# Patient Record
Sex: Male | Born: 1985
Health system: Southern US, Community
[De-identification: ages and names within clinical notes are randomized; demographics above are authoritative.]

## PROBLEM LIST (undated history)

## (undated) DIAGNOSIS — R519 Headache, unspecified: Secondary | ICD-10-CM

## (undated) DIAGNOSIS — T4145XA Adverse effect of unspecified anesthetic, initial encounter: Secondary | ICD-10-CM

## (undated) DIAGNOSIS — M1990 Unspecified osteoarthritis, unspecified site: Secondary | ICD-10-CM

## (undated) DIAGNOSIS — G4733 Obstructive sleep apnea (adult) (pediatric): Principal | ICD-10-CM

## (undated) DIAGNOSIS — J45909 Unspecified asthma, uncomplicated: Secondary | ICD-10-CM

## (undated) DIAGNOSIS — D699 Hemorrhagic condition, unspecified: Secondary | ICD-10-CM

## (undated) DIAGNOSIS — B019 Varicella without complication: Secondary | ICD-10-CM

## (undated) DIAGNOSIS — T8859XA Other complications of anesthesia, initial encounter: Secondary | ICD-10-CM

## (undated) DIAGNOSIS — R112 Nausea with vomiting, unspecified: Secondary | ICD-10-CM

## (undated) DIAGNOSIS — Z9889 Other specified postprocedural states: Secondary | ICD-10-CM

## (undated) DIAGNOSIS — E669 Obesity, unspecified: Secondary | ICD-10-CM

## (undated) DIAGNOSIS — R51 Headache: Secondary | ICD-10-CM

## (undated) DIAGNOSIS — A630 Anogenital (venereal) warts: Secondary | ICD-10-CM

## (undated) DIAGNOSIS — I10 Essential (primary) hypertension: Secondary | ICD-10-CM

## (undated) HISTORY — DX: Headache, unspecified: R51.9

## (undated) HISTORY — PX: KNEE ARTHROSCOPY: SUR90

## (undated) HISTORY — PX: ARTHROSCOPIC REPAIR ACL: SUR80

## (undated) HISTORY — DX: Anogenital (venereal) warts: A63.0

## (undated) HISTORY — DX: Obstructive sleep apnea (adult) (pediatric): G47.33

## (undated) HISTORY — DX: Headache: R51

## (undated) HISTORY — DX: Varicella without complication: B01.9

---
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
## 2007-02-04 ENCOUNTER — Emergency Department (HOSPITAL_COMMUNITY): Admission: EM | Admit: 2007-02-04 | Discharge: 2007-02-04 | Payer: Self-pay | Admitting: Emergency Medicine

## 2007-07-19 ENCOUNTER — Emergency Department (HOSPITAL_COMMUNITY): Admission: EM | Admit: 2007-07-19 | Discharge: 2007-07-19 | Payer: Self-pay | Admitting: Emergency Medicine

## 2008-05-31 ENCOUNTER — Emergency Department (HOSPITAL_COMMUNITY): Admission: EM | Admit: 2008-05-31 | Discharge: 2008-05-31 | Payer: Self-pay | Admitting: Emergency Medicine

## 2009-11-21 ENCOUNTER — Emergency Department (HOSPITAL_COMMUNITY): Admission: EM | Admit: 2009-11-21 | Discharge: 2009-11-21 | Payer: Self-pay | Admitting: Emergency Medicine

## 2010-12-09 ENCOUNTER — Inpatient Hospital Stay (INDEPENDENT_AMBULATORY_CARE_PROVIDER_SITE_OTHER)
Admission: RE | Admit: 2010-12-09 | Discharge: 2010-12-09 | Disposition: A | Discharge status: Home / Self Care | Payer: Self-pay | Source: Ambulatory Visit | Attending: Family Medicine | Admitting: Family Medicine

## 2010-12-09 ENCOUNTER — Emergency Department (HOSPITAL_COMMUNITY)
Admission: EM | Admit: 2010-12-09 | Discharge: 2010-12-09 | Disposition: A | Discharge status: Home / Self Care | Payer: Self-pay | Attending: Emergency Medicine | Admitting: Emergency Medicine

## 2010-12-09 DIAGNOSIS — N342 Other urethritis: Secondary | ICD-10-CM

## 2010-12-09 DIAGNOSIS — J45909 Unspecified asthma, uncomplicated: Secondary | ICD-10-CM | POA: Insufficient documentation

## 2010-12-09 DIAGNOSIS — R300 Dysuria: Secondary | ICD-10-CM | POA: Insufficient documentation

## 2010-12-09 LAB — URINALYSIS, ROUTINE W REFLEX MICROSCOPIC
Bilirubin Urine: NEGATIVE
Ketones, ur: NEGATIVE mg/dL
Nitrite: NEGATIVE
Protein, ur: NEGATIVE mg/dL
Urine Glucose, Fasting: NEGATIVE mg/dL
pH: 6 (ref 5.0–8.0)

## 2010-12-09 LAB — URINE MICROSCOPIC-ADD ON

## 2010-12-09 LAB — POCT URINALYSIS DIPSTICK
Ketones, ur: NEGATIVE mg/dL
Protein, ur: NEGATIVE mg/dL
Urobilinogen, UA: 0.2 mg/dL (ref 0.0–1.0)
pH: 6 (ref 5.0–8.0)

## 2010-12-11 LAB — GC/CHLAMYDIA PROBE AMP, GENITAL
Chlamydia, DNA Probe: NEGATIVE
GC Probe Amp, Genital: NEGATIVE

## 2010-12-12 ENCOUNTER — Emergency Department (HOSPITAL_COMMUNITY)
Admission: EM | Admit: 2010-12-12 | Discharge: 2010-12-13 | Disposition: A | Discharge status: Home / Self Care | Payer: Self-pay | Attending: Emergency Medicine | Admitting: Emergency Medicine

## 2010-12-12 ENCOUNTER — Emergency Department (HOSPITAL_COMMUNITY)
Admission: EM | Admit: 2010-12-12 | Discharge: 2010-12-12 | Disposition: A | Discharge status: Home / Self Care | Payer: Self-pay | Attending: Emergency Medicine | Admitting: Emergency Medicine

## 2010-12-12 DIAGNOSIS — F17200 Nicotine dependence, unspecified, uncomplicated: Secondary | ICD-10-CM | POA: Insufficient documentation

## 2010-12-12 DIAGNOSIS — N342 Other urethritis: Secondary | ICD-10-CM | POA: Insufficient documentation

## 2010-12-12 DIAGNOSIS — N509 Disorder of male genital organs, unspecified: Secondary | ICD-10-CM | POA: Insufficient documentation

## 2010-12-12 DIAGNOSIS — R300 Dysuria: Secondary | ICD-10-CM | POA: Insufficient documentation

## 2010-12-12 DIAGNOSIS — N4889 Other specified disorders of penis: Secondary | ICD-10-CM | POA: Insufficient documentation

## 2010-12-12 DIAGNOSIS — N390 Urinary tract infection, site not specified: Secondary | ICD-10-CM | POA: Insufficient documentation

## 2010-12-12 DIAGNOSIS — J45909 Unspecified asthma, uncomplicated: Secondary | ICD-10-CM | POA: Insufficient documentation

## 2010-12-12 LAB — URINALYSIS, ROUTINE W REFLEX MICROSCOPIC
Bilirubin Urine: NEGATIVE
Ketones, ur: NEGATIVE mg/dL
Nitrite: POSITIVE — AB
Urobilinogen, UA: 0.2 mg/dL (ref 0.0–1.0)

## 2010-12-13 LAB — URINALYSIS, ROUTINE W REFLEX MICROSCOPIC
Hgb urine dipstick: NEGATIVE
Specific Gravity, Urine: 1.031 — ABNORMAL HIGH (ref 1.005–1.030)
Urobilinogen, UA: 1 mg/dL (ref 0.0–1.0)

## 2010-12-13 LAB — URINE MICROSCOPIC-ADD ON

## 2010-12-13 LAB — DIFFERENTIAL
Basophils Absolute: 0 10*3/uL (ref 0.0–0.1)
Lymphocytes Relative: 21 % (ref 12–46)
Lymphs Abs: 1.9 10*3/uL (ref 0.7–4.0)
Monocytes Absolute: 0.8 10*3/uL (ref 0.1–1.0)
Monocytes Relative: 8 % (ref 3–12)
Neutro Abs: 6.4 10*3/uL (ref 1.7–7.7)

## 2010-12-13 LAB — CBC
HCT: 45.1 % (ref 39.0–52.0)
Hemoglobin: 16 g/dL (ref 13.0–17.0)
MCH: 29.6 pg (ref 26.0–34.0)
MCHC: 35.5 g/dL (ref 30.0–36.0)

## 2010-12-13 LAB — URINE CULTURE

## 2010-12-13 LAB — BASIC METABOLIC PANEL
CO2: 29 mEq/L (ref 19–32)
Calcium: 9.6 mg/dL (ref 8.4–10.5)
Glucose, Bld: 82 mg/dL (ref 70–99)
Sodium: 142 mEq/L (ref 135–145)

## 2010-12-14 LAB — URINE CULTURE
Colony Count: NO GROWTH
Culture  Setup Time: 201202291044

## 2010-12-14 LAB — HERPES SIMPLEX VIRUS CULTURE: Culture: DETECTED

## 2011-02-14 ENCOUNTER — Emergency Department (HOSPITAL_COMMUNITY)
Admission: EM | Admit: 2011-02-14 | Discharge: 2011-02-14 | Disposition: A | Discharge status: Home / Self Care | Payer: Self-pay | Attending: Emergency Medicine | Admitting: Emergency Medicine

## 2011-02-14 DIAGNOSIS — F17200 Nicotine dependence, unspecified, uncomplicated: Secondary | ICD-10-CM | POA: Insufficient documentation

## 2011-02-14 DIAGNOSIS — R300 Dysuria: Secondary | ICD-10-CM | POA: Insufficient documentation

## 2011-02-14 DIAGNOSIS — J45909 Unspecified asthma, uncomplicated: Secondary | ICD-10-CM | POA: Insufficient documentation

## 2011-02-14 LAB — URINALYSIS, ROUTINE W REFLEX MICROSCOPIC
Bilirubin Urine: NEGATIVE
Glucose, UA: NEGATIVE mg/dL
Hgb urine dipstick: NEGATIVE
Ketones, ur: NEGATIVE mg/dL
Protein, ur: NEGATIVE mg/dL

## 2011-03-02 NOTE — Op Note (Signed)
[place]. [facility]  Patient:    [name] [name], [name] [name] Visit Number: [id number] MRN: [id number]          Service Type: Attending:  Loreta Ave, M.D. Dictated by:   Loreta Ave, M.D. Proc. Date: 07/10/01                             Operative Report  PREOPERATIVE DIAGNOSIS:  Torn lateral meniscus, right knee.  POSTOPERATIVE DIAGNOSES:  Torn lateral meniscus, right knee, with chondral abrasive changes, lateral femoral condyle.  PROCEDURE:  Right knee exam under anesthesia, arthroscopy, partial lateral meniscectomy and chondroplasty of lateral femoral condyle.  SURGEON:  Loreta Ave, M.D.  ASSISTANT:  Arlys John D. Petrarca, P.A.-C.  ANESTHESIA:  General.  ESTIMATED BLOOD LOSS:  Minimal.  TOURNIQUET TIME:  30 minutes.  SPECIMENS:  None.  CULTURES:  None.  COMPLICATIONS:  None.  DRESSINGS:  Self-compressive.  DESCRIPTION OF PROCEDURE:  The patient was brought to the operating room and placed on the operating table in a supine position.  After adequate anesthesia had been obtained, the right knee was examined.  Full motion, good stability, positive lateral McMurrays.  Tourniquet and leg holder were applied.  Leg prepped and draped in the usual sterile fashion.  Three portals created:  one superolateral, one each medial and lateral parapatellar.  Inflow catheter introduced.  Knee extended.  Arthroscope introduced.  Knee inspected. Patellofemoral joint had good tracking and cartilage.  Cruciate ligaments attached.  Medial meniscus, medial compartment normal.  Lateral meniscus, radial tear.  Junction, middle and posterior third, with fragmentation and intrameniscal tearing.  ______ out and tapered in smoothly on either side salvaging a rim in the mid portion of the tear and a fair amount of the posterior and anterior aspects of the meniscus at completion.  Care taken to remove all fragments and all recesses examined to be sure all  fragments removed.  Superficial grade 2 chondromalacia on the condyle above this, and this was treated with chondroplasty to a stable surface.  Entire knee examined.  Hypertrophic synovitis removed.  No other significant findings appreciated.  ______ fluid removed.  During the procedure, the tourniquet was inflated for 30 minutes at 350 mmHg to allow visualization.  This was deflated at completion.  Portals were then injected with Marcaine.  The portals were closed with 4-0 nylon.  Sterile compressive dressing applied.  Anesthesia reversed, brought to recovery room.  Tolerated surgery well.  No complications. Dictated by:   Loreta Ave, M.D. Attending:  Loreta Ave, M.D. DD:  07/10/01 TD:  07/10/01 Job: [id number]

## 2011-03-02 NOTE — Op Note (Signed)
NAME:  [name] [name], [name] [name]                       ACCOUNT NO.:  [id number]   MEDICAL RECORD NO.:  [id number]                   PATIENT TYPE:  AMB   LOCATION:  DSC                                  FACILITY:  [facility]   PHYSICIAN:  Robert A. Thurston Hole, M.D.              DATE OF BIRTH:  05/27/1986   DATE OF PROCEDURE:  07/08/2002  DATE OF DISCHARGE:  07/08/2002                                 OPERATIVE REPORT   PREOPERATIVE DIAGNOSES:  1. Left knee anterior cruciate ligament tear.  2. Left knee medial collateral tear.  3. Left knee partial posterior cruciate ligament tear.  4. Left knee lateral meniscus tear.   POSTOPERATIVE DIAGNOSES:  1. Left knee anterior cruciate ligament tear.  2. Left knee medial collateral tear.  3. Left knee partial posterior cruciate ligament tear.  4. Left knee lateral meniscus tear.   PROCEDURES:  1. Left knee examination under anesthesia followed by arthroscopically     assisted endoscopic bone/patellar tendon/bone autograft anterior cruciate     ligament reconstruction using 9 x 25 mm femoral Interference Bioscrew and     9 x 25 mm tibial Interference Bioscrew.  2. Left knee medial collateral ligament repair.  3. Left knee partial posterior cruciate ligament tear debridement.  4. Left knee partial lateral meniscectomy.   SURGEON:  Elana Alm. Thurston Hole, M.D.   ASSISTANTS:  Loreta Ave, M.D., and Julien Girt, P.A.   ANESTHESIA:  General.   OPERATIVE TIME:  Three hours.   TOURNIQUET TIME:  One hour and 40 minutes.   COMPLICATIONS:  None.   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  He is now to undergo arthroscopy and repair.   DESCRIPTION OF PROCEDURE:  The patient was brought to the operating room on  July 08, 2002, and placed on the operating  table in the supine  position.  After an adequate level of general anesthesia was obtained, his  left knee was examined under anesthesia.  He had range of motion from 0-125  degrees.  He had a 2+ Lachman and positive pivot shift.  He had 2+ valgus  instability in full extension and in 30 degrees of flexion.  He had 1+  posterior drawer.  He had no varus instability.  He had normal patellar  tracking.  The left leg was prepped using sterile DuraPrep and draped using  sterile technique.  He received Ancef 1 g IV preoperatively for prophylaxis.  The leg was exsanguinated and a thigh tourniquet elevated to 350 mm.  Initially the medial collateral ligament was exposed through a 10 cm medial  incision.  The underlying subcutaneous tissues were incised in line with the  skin incision.  The fascia over the MCL region was  incised longitudinally,  revealing a completely torn medial collateral ligament with the posterior  bundle of the MCL being torn off of the femur and the anterior portion being  torn off of the tibia.  The torn portions of the ligament were carefully  dissected and teased and freed up from surrounding granulation tissue.  The  underlying medial meniscus and medial compartment in the knee could be  visualized through this opening in the medial capsule where the MCL tear  was.  At this point, two separate Arthrex 5.0 suture anchors were placed in  the medial femoral condyle for repair.  One of these with a #2 fiber wire  was used in a weaving locking stitch in the portion of the MCL that was torn  off of the tibia.  This weaving stitch was then brought down to the end of  the ligament and then sutured through the periosteum on the proximal tibia.  Prior to tying this down completely, two other #2 Ethibond sutures were  placed through this portion of the MCL and through the coronary ligaments of  the medial meniscus and tied down.  The posterior aspect of the MCL tear was  then also  repaired back to its femoral attachment using the same suture  anchors.  After this repair was carried out, the knee was tested for medial  stability and the valgus instability was found to be corrected.  At this  point then, attention was turned to inspecting the inside of the knee with  the arthroscope.  Anterolateral and anteromedial portals were made.  The  arthroscope with a pump was attached and placed into the anterolateral  portal and an arthroscopic debrider placed in the anteromedial portal.  The  medial compartment was inspected.  The medial femoral condyle and medial  tibial plateau articular cartilage was intact and the medial meniscus was  intact.  The intercondylar notch was inspected.  The anterior cruciate  ligament was completely torn with significant anterior laxity.  This was  thoroughly debrided and a small notchplasty was performed.  The posterior  cruciate had at least 60-70% still intact, but the anterior bundle was  completely torn and somewhat shredded.  This could not be repaired and this  was debrided, but there was only 1-2 mm of posterior laxity.  There was 5 mm  of anterior laxity and we did not feel that a PCL reconstruction was  indicated because there was still good viability of his posterior cruciate.  The lateral compartment was inspected.  Articular cartilage, lateral  femoral, and lateral plateau were normal.  The lateral meniscus was probed  and he had a radial tear of the posterolateral corner, which was resected.  Thirty to forty percent of the posterolateral corner was resected back to a  stable rim.  The popliteus tendon was intact.  The patellofemoral joint was  inspected.  The articular cartilage in this joint was normal and the patella  tracked normally.  At this point then, the ACL autograft was harvested  through a 5-7 cm anterior incision based over the patellar tendon.  The underlying subcutaneous tissues were incised in line with the skin  incision.  The patellar tendon was measured and found to be 35 mm in width.  A central  10 mm were harvested with 10 x 25 mm of patellar bone and tibial tubercle.  After this was done, then using the tibial drill guide, a Steinmann pin was  drilled up in the ACL insertion  point on the tibial plateau and through a  1.5 cm anteromedial incision a 10 mm drill hole was made in the proximal  tibia over this Steinmann pin.  Through this hole, the posterior femoral  guide was placed in the posterior femoral notch and the Steinmann pin  drilled up into the ACL origin point on the posterior femoral notch and then  overdrilled with a 10 mm drill to a depth of 30 mm, leaving a posterior 2 mm  bone bridge.  A double pin passer was brought up to the tibial tunnel  through the joint and to the femoral tunnel through the femoral cortex and  thigh through a stab wound.  This was used to pass the ACL graft up through  the tibial tunnel and joint into the femoral tunnel.  It was locked into  position there with a 9 x 25 mm Interference Bioscrew.  The knee was then  taken through a full range of motion.  There was found to be in impingement  of the graft.  The tibial bone plug was then locked into its tunnel with a 9  x 25 mm Interference Bioscrew as well with the knee in 30 degrees of flexion  and the tibia held reduced on the femur.  After this was done, he was tested  for stability.  The Lachman and pivot shift had been eliminated and he could  be brought through a full range of motion with no impingement of the graft.  At this point, the VMO fascia over the [facility] repair was closed with 0 Vicryl  suture.  The patellar tendon harvest region was closed loosely with 0  Vicryl.  The subcutaneous tissues over the incisions were closed with 2-0  Vicryl.  The skin was closed with skin staples.  Sterile dressings were  applied and a long-leg splint applied.  The tourniquet had been previously  released.  The patient  was then awaken and taken to the recovery room in  stable condition.  The needle and sponge counts were correct x 2 at the end  of the case.   FOLLOW UP CARE:  He will be [facility]  for IV pain control and neurovascular monitoring.  He will be discharged  tomorrow on Percocet and Naprosyn with touch down weightbearing.  We will  see him back in the office in a week for wound check and follow-up.                                               Robert A. Thurston Hole, M.D.    RAW/MEDQ  D:  07/08/2002  T:  07/10/2002  Job:  [id number]

## 2011-09-28 ENCOUNTER — Emergency Department (HOSPITAL_COMMUNITY)
Admission: EM | Admit: 2011-09-28 | Discharge: 2011-09-28 | Disposition: A | Discharge status: Home / Self Care | Payer: Self-pay | Attending: Emergency Medicine | Admitting: Emergency Medicine

## 2011-09-28 ENCOUNTER — Encounter: Payer: Self-pay | Admitting: Emergency Medicine

## 2011-09-28 DIAGNOSIS — F17200 Nicotine dependence, unspecified, uncomplicated: Secondary | ICD-10-CM | POA: Insufficient documentation

## 2011-09-28 DIAGNOSIS — L299 Pruritus, unspecified: Secondary | ICD-10-CM | POA: Insufficient documentation

## 2011-09-28 DIAGNOSIS — B86 Scabies: Secondary | ICD-10-CM | POA: Insufficient documentation

## 2011-09-28 HISTORY — DX: Obesity, unspecified: E66.9

## 2011-09-28 MED ORDER — PERMETHRIN 5 % EX CREA: TOPICAL_CREAM | CUTANEOUS | Status: AC

## 2011-09-28 NOTE — ED Provider Notes (Signed)
Evaluation and management procedures were performed by the mid-level provider (PA/NP/CNM) under my supervision/collaboration. I was present and available during the ED course. Jaiveer Panas Y.   Amante Fomby Y. Dreon Pineda, MD 09/28/11 0652 

## 2011-09-28 NOTE — ED Notes (Signed)
PT. REPORTS GENERALIZED ITCHY RASHES FOR 3 WEEKS - WORSE PAST FEW DAYS .

## 2011-09-28 NOTE — ED Provider Notes (Signed)
History     CSN: [id number] Arrival date & time: 09/28/2011  1:12 AM   First MD Initiated Contact with Patient 09/28/11 0148      Chief Complaint  Patient presents with  . Rash    (Consider location/radiation/quality/duration/timing/severity/associated sxs/prior treatment) HPI Comments: Patient here with itchy rash which started 3 weeks ago to his groin area, it has now spread to waistlilne, back and in between his finger - reports papular and extremely itchy  Patient is a 25 y.o. male presenting with rash. The history is provided by the patient. No language interpreter was used.  Rash  This is a new problem. The current episode started more than 1 week ago. The problem has not changed since onset.The problem is associated with nothing. There has been no fever. The rash is present on the groin, genitalia and back. The pain is at a severity of 0/10. The patient is experiencing no pain. The pain has been constant since onset. Associated symptoms include itching. Pertinent negatives include no blisters, no pain and no weeping. He has tried nothing for the symptoms. The treatment provided no relief.    Past Medical History  Diagnosis Date  . Obesity     Past Surgical History  Procedure Date  . Knee surgery     No family history on file.  History  Substance Use Topics  . Smoking status: Current Everyday Smoker  . Smokeless tobacco: Not on file  . Alcohol Use: Yes     OCCASIONAL       Review of Systems  Skin: Positive for itching and rash.  All other systems reviewed and are negative.    Allergies  Review of patient's allergies indicates no known allergies.  Home Medications  No current outpatient prescriptions on file.  BP 148/90  Pulse 78  Temp(Src) 97.4 F (36.3 C) (Oral)  Resp 16  SpO2 98%  Physical Exam  Nursing note and vitals reviewed. Constitutional: He is oriented to person, place, and time. He appears well-developed and well-nourished. No distress.   HENT:  Head: Normocephalic and atraumatic.  Right Ear: External ear normal.  Left Ear: External ear normal.  Eyes: Conjunctivae are normal. Pupils are equal, round, and reactive to light.  Neck: Normal range of motion. Neck supple.  Cardiovascular: Normal rate, regular rhythm and normal heart sounds.   Pulmonary/Chest: Effort normal and breath sounds normal. No respiratory distress. He has no wheezes.  Abdominal: Soft. Bowel sounds are normal. He exhibits no distension. There is no tenderness.  Musculoskeletal: Normal range of motion.  Neurological: He is alert and oriented to person, place, and time.  Skin: Rash noted.       Papular rash noted to groin, between fingers and along waist line  Psychiatric: He has a normal mood and affect. His behavior is normal. Judgment and thought content normal.    ED Course  Procedures (including critical care time)  Labs Reviewed - No data to display No results found.   Scabies    MDM  Patient with scabies rash - will treat - instructed in treatment protocol and environmental treatment as well.       Izola Price [place], [place] 09/28/11 [phone]

## 2011-09-28 NOTE — ED Notes (Signed)
Terminal clean upon discharge - scabies diagnosis

## 2012-01-12 ENCOUNTER — Other Ambulatory Visit (HOSPITAL_COMMUNITY): Payer: Self-pay | Admitting: Hematology

## 2013-01-08 ENCOUNTER — Encounter (HOSPITAL_COMMUNITY): Payer: Self-pay | Admitting: Family Medicine

## 2013-01-08 ENCOUNTER — Emergency Department (HOSPITAL_COMMUNITY)
Admission: EM | Admit: 2013-01-08 | Discharge: 2013-01-08 | Disposition: A | Discharge status: Home / Self Care | Payer: Self-pay | Attending: Emergency Medicine | Admitting: Emergency Medicine

## 2013-01-08 DIAGNOSIS — K089 Disorder of teeth and supporting structures, unspecified: Secondary | ICD-10-CM | POA: Insufficient documentation

## 2013-01-08 DIAGNOSIS — J45909 Unspecified asthma, uncomplicated: Secondary | ICD-10-CM | POA: Insufficient documentation

## 2013-01-08 DIAGNOSIS — K0889 Other specified disorders of teeth and supporting structures: Secondary | ICD-10-CM

## 2013-01-08 DIAGNOSIS — F17200 Nicotine dependence, unspecified, uncomplicated: Secondary | ICD-10-CM | POA: Insufficient documentation

## 2013-01-08 DIAGNOSIS — E669 Obesity, unspecified: Secondary | ICD-10-CM | POA: Insufficient documentation

## 2013-01-08 HISTORY — DX: Unspecified asthma, uncomplicated: J45.909

## 2013-01-08 MED ORDER — OXYCODONE-ACETAMINOPHEN 5-325 MG PO TABS: 2.0000 | ORAL_TABLET | ORAL | Status: DC | PRN

## 2013-01-08 MED ORDER — PENICILLIN V POTASSIUM 500 MG PO TABS: 500.0000 mg | ORAL_TABLET | Freq: Four times a day (QID) | ORAL | Status: AC

## 2013-01-08 NOTE — ED Notes (Signed)
Patient states he has had a toothache for the past 2 weeks. Has taken Excedrin and BC without relief of pain.

## 2013-01-08 NOTE — ED Provider Notes (Signed)
History     CSN: [id number]  Arrival date & time 01/08/13  0254   First MD Initiated Contact with Patient 01/08/13 [phone]      Chief Complaint  Patient presents with  . Dental Pain    (Consider location/radiation/quality/duration/timing/severity/associated sxs/prior treatment) HPI Comments: Patient complains of left lower molar pain for the past 2 weeks it is worse over the past 3 days. Denies any trauma. The pain is radiating to his upper jaw as well. No difficulty breathing or swallowing. Taking Excedrin and BC powders without relief. No chest pain, shortness of breath, cough, fever vomiting. Does not have a dentist.  The history is provided by the patient.    Past Medical History  Diagnosis Date  . Obesity   . Asthma     Past Surgical History  Procedure Laterality Date  . Knee surgery    . Arthroscopic repair acl      No family history on file.  History  Substance Use Topics  . Smoking status: Current Every Day Smoker -- 0.50 packs/day    Types: Cigarettes  . Smokeless tobacco: Not on file  . Alcohol Use: Yes     Comment: OCCASIONAL       Review of Systems  Constitutional: Negative for activity change and appetite change.  HENT: Positive for dental problem. Negative for congestion and rhinorrhea.   Respiratory: Negative for cough and shortness of breath.   Cardiovascular: Negative for chest pain.  Gastrointestinal: Negative for nausea, vomiting and abdominal pain.  Genitourinary: Negative for dysuria and hematuria.  Musculoskeletal: Negative for back pain.  Skin: Negative for wound.  Neurological: Negative for dizziness, weakness and headaches.  A complete 10 system review of systems was obtained and all systems are negative except as noted in the HPI and PMH.    Allergies  Review of patient's allergies indicates no known allergies.  Home Medications  No current outpatient prescriptions on file.  BP 145/73  Pulse 73  Temp(Src) 97.7 F (36.5 C)  (Oral)  Resp 18  Ht 5\' 11"  (1.803 m)  Wt 365 lb (165.563 kg)  BMI 50.93 kg/m2  SpO2 97%  Physical Exam  Constitutional: He is oriented to person, place, and time. He appears well-developed and well-nourished. No distress.  HENT:  Head: Normocephalic and atraumatic.  Mouth/Throat: Oropharynx is clear and moist. No oropharyngeal exudate.    TTP to palpation. No abscess.  Floor of mouth soft. No trismus  Eyes: Conjunctivae and EOM are normal. Pupils are equal, round, and reactive to light.  Neck: Normal range of motion. Neck supple.  Cardiovascular: Normal rate, regular rhythm and normal heart sounds.   No murmur heard. Pulmonary/Chest: Effort normal and breath sounds normal. No respiratory distress.  Abdominal: Soft. There is no tenderness. There is no rebound and no guarding.  Musculoskeletal: Normal range of motion. He exhibits no edema and no tenderness.  Neurological: He is alert and oriented to person, place, and time. No cranial nerve deficit. He exhibits normal muscle tone. Coordination normal.  Skin: Skin is warm.    ED Course  Procedures (including critical care time)  Labs Reviewed - No data to display No results found.   No diagnosis found.    MDM  Dental pain without evidence of abscess. Floor of mouth soft, no Ludwig angina.  No difficulty breathing or swallowing. Pain medication, antibiotics, referral to dentistry.       Glynn Octave, MD 01/08/13 [phone]

## 2013-02-20 ENCOUNTER — Encounter (HOSPITAL_COMMUNITY): Payer: Self-pay | Admitting: Emergency Medicine

## 2013-02-20 ENCOUNTER — Emergency Department (HOSPITAL_COMMUNITY)
Admission: EM | Admit: 2013-02-20 | Discharge: 2013-02-20 | Disposition: A | Discharge status: Home / Self Care | Payer: Self-pay | Attending: Emergency Medicine | Admitting: Emergency Medicine

## 2013-02-20 DIAGNOSIS — E669 Obesity, unspecified: Secondary | ICD-10-CM | POA: Insufficient documentation

## 2013-02-20 DIAGNOSIS — R110 Nausea: Secondary | ICD-10-CM | POA: Insufficient documentation

## 2013-02-20 DIAGNOSIS — J45909 Unspecified asthma, uncomplicated: Secondary | ICD-10-CM | POA: Insufficient documentation

## 2013-02-20 DIAGNOSIS — F17200 Nicotine dependence, unspecified, uncomplicated: Secondary | ICD-10-CM | POA: Insufficient documentation

## 2013-02-20 DIAGNOSIS — H53149 Visual discomfort, unspecified: Secondary | ICD-10-CM | POA: Insufficient documentation

## 2013-02-20 DIAGNOSIS — Z79899 Other long term (current) drug therapy: Secondary | ICD-10-CM | POA: Insufficient documentation

## 2013-02-20 DIAGNOSIS — Z8669 Personal history of other diseases of the nervous system and sense organs: Secondary | ICD-10-CM

## 2013-02-20 DIAGNOSIS — Z7982 Long term (current) use of aspirin: Secondary | ICD-10-CM | POA: Insufficient documentation

## 2013-02-20 DIAGNOSIS — K089 Disorder of teeth and supporting structures, unspecified: Secondary | ICD-10-CM | POA: Insufficient documentation

## 2013-02-20 DIAGNOSIS — K0889 Other specified disorders of teeth and supporting structures: Secondary | ICD-10-CM

## 2013-02-20 DIAGNOSIS — G43909 Migraine, unspecified, not intractable, without status migrainosus: Secondary | ICD-10-CM | POA: Insufficient documentation

## 2013-02-20 HISTORY — DX: Headache: R51

## 2013-02-20 MED ORDER — DIPHENHYDRAMINE HCL 50 MG/ML IJ SOLN
50.0000 mg | Freq: Once | INTRAMUSCULAR | Status: AC
  Administered 2013-02-20: 50 mg via INTRAMUSCULAR
  Filled 2013-02-20: qty 1

## 2013-02-20 MED ORDER — METOCLOPRAMIDE HCL 5 MG/ML IJ SOLN
10.0000 mg | Freq: Once | INTRAMUSCULAR | Status: AC
  Administered 2013-02-20: 10 mg via INTRAMUSCULAR
  Filled 2013-02-20: qty 2

## 2013-02-20 NOTE — ED Notes (Signed)
Pt sts that he has had a HA for the past 5 days. Pt has a hx of this and takes medication at home. Pt home meds are not working.

## 2013-02-20 NOTE — Progress Notes (Signed)
[facility] CL gave patient an OC application.

## 2013-02-20 NOTE — ED Provider Notes (Signed)
History     CSN: [id number]  Arrival date & time 02/20/13  7829   First MD Initiated Contact with Patient 02/20/13 1143      No chief complaint on file.   (Consider location/radiation/quality/duration/timing/severity/associated sxs/prior treatment) HPI Comments: [name] [name] is a 27 y/o with PMHx of migraines and asthma, presenting to the ED with a migraine episode. Patient reported that he started with a headache on Monday that has gotten progressively worse over the past couple of days. Described headache as a constat throbbing sensation without radiation. Patient reported that he has been experiencing dental pain that has been ongoing, but was seen by dentist yesterday, Dr. Paralee Cancel - was prescribed pain medications, antibiotic therapy, and oral surgery referral. Patient reported that he has had a history of migraines since he was much younger. Associated symptoms as nausea and photophobia. Denied blurred vision, chest pain, shortness of breathe, difficulty breathing, abdominal pain, diarrhea, vomiting, numbness and tingling to extremities, head injury, trauma, amnesia.    The history is provided by the patient. No language interpreter was used.    Past Medical History  Diagnosis Date  . Obesity   . Asthma   . Headache     Past Surgical History  Procedure Laterality Date  . Knee surgery    . Arthroscopic repair acl      No family history on file.  History  Substance Use Topics  . Smoking status: Current Every Day Smoker -- 0.50 packs/day    Types: Cigarettes  . Smokeless tobacco: Not on file  . Alcohol Use: Yes     Comment: OCCASIONAL       Review of Systems  Constitutional: Negative for fever, chills and fatigue.  HENT: Positive for dental problem. Negative for sore throat, mouth sores, neck pain and neck stiffness.   Eyes: Negative for pain and visual disturbance.  Respiratory: Negative for cough, chest tightness and shortness of breath.   Cardiovascular:  Negative for chest pain.  Gastrointestinal: Positive for nausea. Negative for vomiting, abdominal pain, diarrhea and constipation.  Genitourinary: Negative for decreased urine volume and difficulty urinating.  Neurological: Positive for headaches. Negative for dizziness, weakness, light-headedness and numbness.  All other systems reviewed and are negative.    Allergies  Review of patient's allergies indicates no known allergies.  Home Medications   Current Outpatient Rx  Name  Route  Sig  Dispense  Refill  . amoxicillin (AMOXIL) 500 MG capsule   Oral   Take 500 mg by mouth 4 (four) times daily.         Marland Kitchen aspirin 325 MG tablet   Oral   Take 325 mg by mouth daily.         Marland Kitchen aspirin-acetaminophen-caffeine (EXCEDRIN MIGRAINE) 250-250-65 MG per tablet   Oral   Take 1 tablet by mouth every 6 (six) hours as needed for pain.         . Aspirin-Salicylamide-Caffeine (BC FAST PAIN RELIEF) 650-195-33.3 MG PACK   Oral   Take 1 packet by mouth every 6 (six) hours as needed (pain).         Marland Kitchen diphenhydramine-acetaminophen (TYLENOL PM) 25-500 MG TABS   Oral   Take 1 tablet by mouth at bedtime as needed (pain/sleep).         Marland Kitchen HYDROcodone-acetaminophen (NORCO) 7.5-325 MG per tablet   Oral   Take 1 tablet by mouth every 6 (six) hours as needed for pain.           BP  163/103  Pulse 83  Temp(Src) 98.2 F (36.8 C) (Oral)  Resp 18  SpO2 100%  Physical Exam  Nursing note and vitals reviewed. Constitutional: He is oriented to person, place, and time. He appears well-developed and well-nourished. No distress.  HENT:  Head: Normocephalic and atraumatic.  Mouth/Throat: Oropharynx is clear and moist. No oropharyngeal exudate.    Uvula midline, symmetrical elevation  Mouth: Negative facial swelling. Negative erythema, swelling, lesions, sores, inflammation noted to buccal mucosa. Negative trismus, negative subglingual lesion - r/o Ludwig's angina. Negative abscess and cyst  formation. Negative bleeding or drainage from gums.   Eyes: Conjunctivae and EOM are normal. Pupils are equal, round, and reactive to light. Right eye exhibits no discharge. Left eye exhibits no discharge.  Neck: Normal range of motion. No tracheal deviation present.  Negative nuchal rigidity Negative neck stiffness  Cardiovascular: Normal rate, regular rhythm and normal heart sounds.  Exam reveals no friction rub.   No murmur heard. Pulses:      Radial pulses are 2+ on the right side, and 2+ on the left side.       Dorsalis pedis pulses are 2+ on the right side, and 2+ on the left side.  Pulmonary/Chest: Effort normal and breath sounds normal. No respiratory distress. He has no wheezes. He has no rales.  Musculoskeletal: Normal range of motion. He exhibits no edema and no tenderness.  Full ROM to upper and lower extremities bilaterally Strength 5+/5+ to upper and lower extremities bilaterally  Lymphadenopathy:    He has no cervical adenopathy.  Neurological: He is oriented to person, place, and time. No cranial nerve deficit. He exhibits normal muscle tone. Coordination normal.  Sensation intact to upper and lower extremities bilaterally with differentiation to sharp and dull sensation.  Skin: Skin is warm and dry. No rash noted. He is not diaphoretic. No erythema.  Psychiatric: He has a normal mood and affect. His behavior is normal. Thought content normal.    ED Course  Procedures (including critical care time)  Labs Reviewed - No data to display No results found.   1. Migraine   2. History of migraine   3. Pain, dental       MDM  Patient has long history of migraines. Pain controlled in ED setting. No neurovascular, motor or sensory damage noted. Patient discharged. Referred to neurology for follow-up. Discussed with patient to continue medications for dental pain and follow-up with oral surgeon that dentist referred patient to. Discussed with patient to monitor symptoms and  if symptoms are to worsen or change to report back to the ED. Patient agreed to plan of care, understood, all questions answered.   Raymon Mutton, PA-C 02/20/13 1712

## 2013-02-23 NOTE — ED Provider Notes (Signed)
Medical screening examination/treatment/procedure(s) were performed by non-physician practitioner and as supervising physician I was immediately available for consultation/collaboration.  Desteni Piscopo L Kersten Salmons, MD 02/23/13 0711 

## 2013-03-24 ENCOUNTER — Encounter (HOSPITAL_COMMUNITY): Payer: Self-pay | Admitting: *Deleted

## 2013-03-24 ENCOUNTER — Emergency Department (HOSPITAL_COMMUNITY)
Admission: EM | Admit: 2013-03-24 | Discharge: 2013-03-25 | Disposition: A | Discharge status: Home / Self Care | Payer: Self-pay | Attending: Emergency Medicine | Admitting: Emergency Medicine

## 2013-03-24 DIAGNOSIS — Z79899 Other long term (current) drug therapy: Secondary | ICD-10-CM | POA: Insufficient documentation

## 2013-03-24 DIAGNOSIS — R040 Epistaxis: Secondary | ICD-10-CM | POA: Insufficient documentation

## 2013-03-24 DIAGNOSIS — F17200 Nicotine dependence, unspecified, uncomplicated: Secondary | ICD-10-CM | POA: Insufficient documentation

## 2013-03-24 DIAGNOSIS — J45909 Unspecified asthma, uncomplicated: Secondary | ICD-10-CM | POA: Insufficient documentation

## 2013-03-24 DIAGNOSIS — E669 Obesity, unspecified: Secondary | ICD-10-CM | POA: Insufficient documentation

## 2013-03-24 HISTORY — DX: Hemorrhagic condition, unspecified: D69.9

## 2013-03-24 MED ORDER — OXYMETAZOLINE HCL 0.05 % NA SOLN
1.0000 | Freq: Two times a day (BID) | NASAL | Status: DC
  Administered 2013-03-25: 1 via NASAL
  Filled 2013-03-24: qty 15

## 2013-03-24 NOTE — ED Notes (Signed)
Pt states that he had a nosebleed this pm that lasted approx an hour; bleeding is controlled upon arrival to hospital; pt reports that he had a nosebleed last pm for approx an hour as well.

## 2013-03-31 NOTE — ED Provider Notes (Addendum)
History     CSN: [id number]  Arrival date & time 03/24/13  2325   First MD Initiated Contact with Patient 03/24/13 2339      Chief Complaint  Patient presents with  . Epistaxis    (Consider location/radiation/quality/duration/timing/severity/associated sxs/prior treatment) HPI Comments: Down time note Pt states that he had a nosebleed this pm that lasted approx an hour; bleeding is controlled upon arrival to hospital; pt reports that he had a nosebleed last pm for approx an hour as well.  Patient is a 27 y.o. male presenting with nosebleeds. The history is provided by a parent.  Epistaxis Timing:  Sporadic Progression:  Resolved Chronicity:  New Worsened by:  Nothing tried   Past Medical History  Diagnosis Date  . Obesity   . Asthma   . Headache(784.0)   . Bleeding disorder     pt reports hospitalized in 2013 for bleeding disorder    Past Surgical History  Procedure Laterality Date  . Knee surgery    . Arthroscopic repair acl      No family history on file.  History  Substance Use Topics  . Smoking status: Current Every Day Smoker -- 0.50 packs/day    Types: Cigarettes  . Smokeless tobacco: Not on file  . Alcohol Use: Yes     Comment: OCCASIONAL       Review of Systems  HENT: Positive for nosebleeds.   All other systems reviewed and are negative.    Allergies  Review of patient's allergies indicates no known allergies.  Home Medications   Current Outpatient Rx  Name  Route  Sig  Dispense  Refill  . guaifenesin (ROBITUSSIN) 100 MG/5ML syrup   Oral   Take 200 mg by mouth 3 (three) times daily as needed for cough.         Marland Kitchen OVER THE COUNTER MEDICATION   Oral   Take 1 tablet by mouth once. OTC Cold Pill         . Phenyleph-CPM-DM-Aspirin (ALKA-SELTZER PLUS COLD & COUGH PO)   Oral   Take 1 tablet by mouth 2 (two) times daily.           BP 137/91  Pulse 99  Temp(Src) 97.4 F (36.3 C) (Oral)  Resp 20  Ht 5\' 10"  (1.778 m)  Wt 350 lb  (158.759 kg)  BMI 50.22 kg/m2  SpO2 99%  Physical Exam  Nursing note and vitals reviewed. Constitutional: He appears well-developed and well-nourished.  HENT:  Head: Normocephalic.  Cardiovascular: Normal rate.   Neurological: He is alert.  Skin: Skin is warm.    ED Course  Procedures (including critical care time)  Labs Reviewed - No data to display No results found.   1. Bleeding nose       MDM  Down time         Arman Filter, NP 03/31/13 2019  Arman Filter, NP 04/07/13 2025

## 2013-04-01 NOTE — ED Provider Notes (Signed)
Medical screening examination/treatment/procedure(s) were performed by non-physician practitioner and as supervising physician I was immediately available for consultation/collaboratio  Neely Kammerer, MD 04/01/13 0701 

## 2013-04-08 NOTE — ED Provider Notes (Signed)
Medical screening examination/treatment/procedure(s) were performed by non-physician practitioner and as supervising physician I was immediately available for consultation/collaboration.   Jermarcus Mcfadyen, MD 04/08/13 0631 

## 2013-05-17 ENCOUNTER — Encounter (HOSPITAL_COMMUNITY): Payer: Self-pay | Admitting: Emergency Medicine

## 2013-05-17 ENCOUNTER — Emergency Department (HOSPITAL_COMMUNITY)
Admission: EM | Admit: 2013-05-17 | Discharge: 2013-05-17 | Disposition: A | Discharge status: Home / Self Care | Payer: Self-pay | Attending: Emergency Medicine | Admitting: Emergency Medicine

## 2013-05-17 DIAGNOSIS — H04001 Unspecified dacryoadenitis, right lacrimal gland: Secondary | ICD-10-CM

## 2013-05-17 DIAGNOSIS — H04009 Unspecified dacryoadenitis, unspecified lacrimal gland: Secondary | ICD-10-CM | POA: Insufficient documentation

## 2013-05-17 DIAGNOSIS — H5789 Other specified disorders of eye and adnexa: Secondary | ICD-10-CM | POA: Insufficient documentation

## 2013-05-17 DIAGNOSIS — H538 Other visual disturbances: Secondary | ICD-10-CM | POA: Insufficient documentation

## 2013-05-17 DIAGNOSIS — F17200 Nicotine dependence, unspecified, uncomplicated: Secondary | ICD-10-CM | POA: Insufficient documentation

## 2013-05-17 DIAGNOSIS — Z8669 Personal history of other diseases of the nervous system and sense organs: Secondary | ICD-10-CM | POA: Insufficient documentation

## 2013-05-17 DIAGNOSIS — Z862 Personal history of diseases of the blood and blood-forming organs and certain disorders involving the immune mechanism: Secondary | ICD-10-CM | POA: Insufficient documentation

## 2013-05-17 DIAGNOSIS — J45909 Unspecified asthma, uncomplicated: Secondary | ICD-10-CM | POA: Insufficient documentation

## 2013-05-17 DIAGNOSIS — H109 Unspecified conjunctivitis: Secondary | ICD-10-CM | POA: Insufficient documentation

## 2013-05-17 DIAGNOSIS — E669 Obesity, unspecified: Secondary | ICD-10-CM | POA: Insufficient documentation

## 2013-05-17 MED ORDER — GENTAMICIN SULFATE 0.3 % OP SOLN: 1.0000 [drp] | OPHTHALMIC | Status: DC

## 2013-05-17 MED ORDER — CEPHALEXIN 500 MG PO CAPS: 500.0000 mg | ORAL_CAPSULE | Freq: Four times a day (QID) | ORAL | Status: DC

## 2013-05-17 NOTE — ED Provider Notes (Signed)
Medical screening examination/treatment/procedure(s) were performed by non-physician practitioner and as supervising physician I was immediately available for consultation/collaboration.   Rolan Bucco, MD 05/17/13 [phone]

## 2013-05-17 NOTE — ED Provider Notes (Signed)
CSN: [id number]     Arrival date & time 05/17/13  1416 History     First MD Initiated Contact with Patient 05/17/13 1421     Chief Complaint  Patient presents with  . Conjunctivitis    r/eye pain x 3 days   (Consider location/radiation/quality/duration/timing/severity/associated sxs/prior Treatment) HPI  [name] [name] is a 27 y.o. male complaining of atraumatic right eye redness and irritation worsening over the course of 3 days. Patient wakes with eye crusted shut. States that there is clear discharge and his vision is blurry first thing in the morning. Patient also states that there is a bump to the medial lower lid. Patient denies eye pain, sick contacts, decrease in visual acuity, fever, nausea vomiting, headache. Patient denies contact lens use.   Past Medical History  Diagnosis Date  . Obesity   . Asthma   . Headache(784.0)   . Bleeding disorder     pt reports hospitalized in 2013 for bleeding disorder   Past Surgical History  Procedure Laterality Date  . Knee surgery    . Arthroscopic repair acl     No family history on file. History  Substance Use Topics  . Smoking status: Current Every Day Smoker -- 0.50 packs/day    Types: Cigarettes  . Smokeless tobacco: Not on file  . Alcohol Use: Yes     Comment: OCCASIONAL     Review of Systems 10 systems reviewed and found to be negative, except as noted in the HPI   Allergies  Review of patient's allergies indicates no known allergies.  Home Medications   Current Outpatient Rx  Name  Route  Sig  Dispense  Refill  . guaifenesin (ROBITUSSIN) 100 MG/5ML syrup   Oral   Take 200 mg by mouth 3 (three) times daily as needed for cough.         Marland Kitchen OVER THE COUNTER MEDICATION   Oral   Take 1 tablet by mouth once. OTC Cold Pill         . Phenyleph-CPM-DM-Aspirin (ALKA-SELTZER PLUS COLD & COUGH PO)   Oral   Take 1 tablet by mouth 2 (two) times daily.          BP 134/74  Pulse 89  Temp(Src) 98.4 F (36.9 C)  (Oral)  Resp 18  SpO2 99% Physical Exam  Nursing note and vitals reviewed. Constitutional: He is oriented to person, place, and time. He appears well-developed and well-nourished. No distress.  HENT:  Head: Normocephalic.  Eyes: EOM are normal. Pupils are equal, round, and reactive to light. Left eye exhibits discharge. Left eye exhibits no chemosis, no exudate and no hordeolum. No foreign body present in the left eye. Left conjunctiva is injected. Left conjunctiva has no hemorrhage. No scleral icterus. Left eye exhibits normal extraocular motion and no nystagmus. Pupils are equal.  Moderate conjunctival injection to right eye with clear watery discharge. Patient has mild swelling and tenderness to lacrimal sac, trace warmth.   Cardiovascular: Normal rate.   Pulmonary/Chest: Effort normal. No stridor.  Musculoskeletal: Normal range of motion.  Neurological: He is alert and oriented to person, place, and time.  Psychiatric: He has a normal mood and affect.    ED Course   Procedures (including critical care time)  Labs Reviewed - No data to display No results found. 1. Conjunctivitis   2. Dacryoadenitis of right lacrimal gland     MDM   Filed Vitals:   05/17/13 1428  BP: 134/74  Pulse: 89  Temp: 98.4  F (36.9 C)  TempSrc: Oral  Resp: 18  SpO2: 99%     [name] [name] is a 27 y.o. male with right eye redness, clear discharge and swelling to lacrimal sac.  Pt is hemodynamically stable, appropriate for, and amenable to discharge at this time. Pt verbalized understanding and agrees with care plan. All questions answered. Outpatient follow-up and specific return precautions discussed.    New Prescriptions   CEPHALEXIN (KEFLEX) 500 MG CAPSULE    Take 1 capsule (500 mg total) by mouth 4 (four) times daily.   GENTAMICIN (GARAMYCIN) 0.3 % OPHTHALMIC SOLUTION    Place 1 drop into the right eye every 4 (four) hours.    Note: Portions of this report may have been transcribed using  voice recognition software. Every effort was made to ensure accuracy; however, inadvertent computerized transcription errors may be present    Wynetta Emery, PA-C 05/17/13 1441

## 2013-05-17 NOTE — ED Notes (Signed)
Pain and redness in r/eye x 3 days

## 2013-11-04 ENCOUNTER — Encounter (HOSPITAL_COMMUNITY): Payer: Self-pay | Admitting: Emergency Medicine

## 2013-11-04 ENCOUNTER — Emergency Department (HOSPITAL_COMMUNITY)
Admission: EM | Admit: 2013-11-04 | Discharge: 2013-11-04 | Disposition: A | Discharge status: Home / Self Care | Payer: BC Managed Care – PPO | Attending: Emergency Medicine | Admitting: Emergency Medicine

## 2013-11-04 DIAGNOSIS — I10 Essential (primary) hypertension: Secondary | ICD-10-CM | POA: Insufficient documentation

## 2013-11-04 DIAGNOSIS — F17200 Nicotine dependence, unspecified, uncomplicated: Secondary | ICD-10-CM | POA: Insufficient documentation

## 2013-11-04 DIAGNOSIS — J45909 Unspecified asthma, uncomplicated: Secondary | ICD-10-CM | POA: Insufficient documentation

## 2013-11-04 DIAGNOSIS — E119 Type 2 diabetes mellitus without complications: Secondary | ICD-10-CM | POA: Insufficient documentation

## 2013-11-04 DIAGNOSIS — E669 Obesity, unspecified: Secondary | ICD-10-CM | POA: Insufficient documentation

## 2013-11-04 DIAGNOSIS — L600 Ingrowing nail: Secondary | ICD-10-CM

## 2013-11-04 MED ORDER — CEPHALEXIN 500 MG PO CAPS: 500.0000 mg | ORAL_CAPSULE | Freq: Four times a day (QID) | ORAL | Status: DC

## 2013-11-04 MED ORDER — SULFAMETHOXAZOLE-TRIMETHOPRIM 800-160 MG PO TABS: 1.0000 | ORAL_TABLET | Freq: Two times a day (BID) | ORAL | Status: AC

## 2013-11-04 NOTE — ED Provider Notes (Signed)
CSN: [id number]     Arrival date & time 11/04/13  1805 History   First MD Initiated Contact with Patient 11/04/13 1847     This chart was scribed for [name] [name], non-physician practitioner, working with [name]. [name], MD by Ellin Mayhew, ED Scribe. This patient was seen in room WTR5/WTR5 and the patient's care was started at 7:55 PM.  Chief Complaint  Patient presents with  . Foot Injury   Patient is a 28 y.o. male presenting with foot injury. The history is provided by the patient. No language interpreter was used.  Foot Injury Associated symptoms: no fever    HPI Comments: [name] [name] is a 28 y.o. male who presents to the Emergency Department complaining of constant R foot pain on his big toe. The pain has progressively worsened throughout a period of approximately 2-3 months. 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 While relaxing, he states his pain lessens to approximately 6/10. Patient states that for the past two weeks there has been consistent drainage with blood. Yesterday, he washed the area with peroxide with temporary relief. Patient denies any fever, numbness or loss of sensation. Patient has a history of HTN, diabetes and a bleeding disorder. He states this has been preventing him from working without pain on a day-to-day basis.  Past Medical History  Diagnosis Date  . Obesity   . Asthma   . Headache(784.0)   . Bleeding disorder     pt reports hospitalized in 2013 for bleeding disorder   Past Surgical History  Procedure Laterality Date  . Knee surgery    . Arthroscopic repair acl     Family History  Problem Relation Age of Onset  . Diabetes Mother   . Hypertension Mother   . Hypertension Father   . Diabetes Father    History  Substance Use Topics  . Smoking status: Current Every Day Smoker -- 0.50 packs/day    Types: Cigarettes  .  Smokeless tobacco: Not on file  . Alcohol Use: Yes     Comment: OCCASIONAL     Review of Systems  Constitutional: Negative for fever.  Musculoskeletal: Positive for joint swelling.       R foot pain with bloody drainage.  Neurological: Negative for weakness and numbness.  All other systems reviewed and are negative.   Allergies  Review of patient's allergies indicates no known allergies.  Home Medications   Current Outpatient Rx  Name  Route  Sig  Dispense  Refill  . diphenhydramine-acetaminophen (TYLENOL PM) 25-500 MG TABS   Oral   Take 1 tablet by mouth at bedtime as needed (sleep).         . cephALEXin (KEFLEX) 500 MG capsule   Oral   Take 1 capsule (500 mg total) by mouth 4 (four) times daily.   28 capsule   0   . sulfamethoxazole-trimethoprim (BACTRIM DS,SEPTRA DS) 800-160 MG per tablet   Oral   Take 1 tablet by mouth 2 (two) times daily.   14 tablet   0    Triage Vitals: BP 167/103  Pulse 93  Resp 16  SpO2 97%  Physical Exam  Nursing note and vitals reviewed. Constitutional: He is oriented to person, place, and time. He appears well-developed and well-nourished. No distress.  HENT:  Head: Normocephalic and atraumatic.  Eyes: Conjunctivae and EOM are normal. No scleral icterus.  Neck: Normal range of motion.  Cardiovascular: Normal rate, regular rhythm and intact distal pulses.   DP and PT pulses 2+ in RLE. Capillary refill normal in all toes of R foot.  Pulmonary/Chest: Effort normal. No respiratory distress.  Musculoskeletal: Normal range of motion.       Right ankle: Normal.       Right foot: He exhibits tenderness and swelling. He exhibits normal range of motion, no bony tenderness, normal capillary refill, no crepitus and no laceration.       Feet:  No decreased ROM of R foot or toes. TTP to lateral distal aspect of R nailbed with associated swelling and scant purulent drainage. No red linear streaking or heat to touch.  Neurological: He is alert  and oriented to person, place, and time.  No gross sensory deficits appreciated. Patient able to wiggle all toes.  Skin: Skin is warm and dry. No rash noted. He is not diaphoretic. No erythema. No pallor.  Psychiatric: He has a normal mood and affect. His behavior is normal.   ED Course  NAIL REMOVAL Date/Time: 11/04/2013 9:03 PM Performed by: [name] MaduraHUMES, Erynne Kealey Authorized by: [name] MaduraHUMES, [name] Consent: Verbal consent obtained. written consent not obtained. The procedure was performed in an emergent situation. Risks and benefits: risks, benefits and alternatives were discussed Consent given by: patient Patient understanding: patient states understanding of the procedure being performed Patient consent: the patient's understanding of the procedure matches consent given Procedure consent: procedure consent matches procedure scheduled Relevant documents: relevant documents present and verified Test results: test results available and properly labeled Site marked: the operative site was marked Imaging studies: imaging studies available Required items: required blood products, implants, devices, and special equipment available Patient identity confirmed: verbally with patient and arm band Time out: Immediately prior to procedure a "time out" was called to verify the correct patient, procedure, equipment, support staff and site/side marked as required. Location: right foot Location details: right big toe Anesthesia: local infiltration Local anesthetic: lidocaine 2% without epinephrine Anesthetic total: 6 ml Patient sedated: no Preparation: skin prepped with Betadine Amount removed: partial Wedge excision of skin of nail fold: yes Nail bed sutured: no Nail matrix removed: partial Removed nail replaced and anchored: no Dressing: dressing applied Patient tolerance: Patient tolerated the procedure well with no immediate complications.    DIAGNOSTIC STUDIES: Oxygen Saturation is 97% on room air,  adequate by my interpretation.    COORDINATION OF CARE: 7:59 PM-Discussed my suspicion of having a perionychia. Performed a drainage procedure of the R toe and will prescribe antibiotics. Discussed possibility of an ingrown toe nail after not finding much pus in the area. Treatment plan discussed with patient and patient agrees.  8:32 PM-Applied iodine solution and lidocaine to R hallux to remove any pus and a possible ingrown toe nail. Patient tolerated treatment well without complications. Recommended bathing the R foot in regular warm water to help release any built-up fluid in the area.  Labs Review Labs Reviewed - No data to display Imaging Review No results found.  EKG Interpretation   None      MDM   1. Ingrown right big toenail    Uncomplicated ingrown toenail. Patient well and nontoxic appearing, hemodynamically stable, and afebrile. He is neurovascularly intact on physical exam; no gross sensory deficits appreciated. Scalpel used to explore wound; however, no pus expelled. Less likely that paronychia is cause of patient's symptoms. Have lifted lateral edge of nail and trimmed as well as excised skin of nail  fold for symptoms. Patient tolerated procedure well with no immediate complications. He is stable for discharge with instructions for warm soaks. Will place on antibiotics to cover for infection. Return precautions provided and patient agreeable to plan with no unaddressed concerns.  I personally performed the services described in this documentation, which was scribed in my presence. The recorded information has been reviewed and is accurate.    [name] Madura, PA-C 11/04/13 2138

## 2013-11-04 NOTE — ED Notes (Signed)
Wound dressed with bacitracin, Telfa gauze and coban. Pt instructed on further wound care.

## 2013-11-04 NOTE — ED Notes (Signed)
Pt ambulatory to exam room with steady gait.  

## 2013-11-04 NOTE — Discharge Instructions (Signed)
Ingrown Toenail An ingrown toenail occurs when the sharp edge of your toenail grows into the skin. Causes of ingrown toenails include toenails clipped too far back or poorly fitting shoes. Activities involving sudden stops (basketball, tennis) causing "toe jamming" may lead to an ingrown nail. HOME CARE INSTRUCTIONS   Soak the whole foot in warm soapy water for 20 minutes, 3 times per day.  You may lift the edge of the nail away from the sore skin by wedging a small piece of cotton under the corner of the nail. Be careful not to dig (traumatize) and cause more injury to the area.  Wear shoes that fit well. While the ingrown nail is causing problems, sandals may be beneficial.  Trim your toenails regularly and carefully. Cut your toenails straight across, not in a curve. This will prevent injury to the skin at the corners of the toenail.  Keep your feet clean and dry.  Crutches may be helpful early in treatment if walking is painful.  Antibiotics, if prescribed, should be taken as directed.  Return for a wound check in 2 days or as directed.  Only take over-the-counter or prescription medicines for pain, discomfort, or fever as directed by your caregiver. SEEK IMMEDIATE MEDICAL CARE IF:   You have a fever.  You have increasing pain, redness, swelling, or heat at the wound site.  Your toe is not better in 7 days. If conservative treatment is not successful, surgical removal of a portion or all of the nail may be necessary. MAKE SURE YOU:   Understand these instructions.  Will watch your condition.  Will get help right away if you are not doing well or get worse. Document Released: 09/28/2000 Document Revised: 12/24/2011 Document Reviewed: 09/22/2008 [facility] Patient Information 2014 [place]ExitCare, [place]LLC. Paronychia  Paronychia is an infection of the skin caused by germs. It happens by the fingernail or toenail. You can avoid it by not:  Pulling on hangnails.  Nail biting.  Thumb  sucking.  Cutting fingernails and toenails too short.  Cutting the skin at the base and sides of the fingernail or toenail (cuticle). HOME CARE  Keep the fingers or toes very dry. Put rubber gloves over cotton gloves when putting hands in water.  Keep the wound clean and bandaged (dressed) as told by your doctor.  Soak the fingers or toes in warm water for 15 to 20 minutes. Soak them 3 to 4 times per day for germ infections. Fungal infections are difficult to treat. Fungal infections often require treatment for a long time.  Only take medicine as told by your doctor. GET HELP RIGHT AWAY IF:   You have redness, puffiness (swelling), or pain that gets worse.  You see yellowish-white fluid (pus) coming from the wound.  You have a fever.  You have a bad smell coming from the wound or bandage. MAKE SURE YOU:  Understand these instructions.  Will watch your condition.  Will get help if you are not doing well or get worse. Document Released: 09/19/2009 Document Revised: 12/24/2011 Document Reviewed: 09/19/2009 [facility] Patient Information 2014 [place]ExitCare, MarylandLLC.

## 2013-11-04 NOTE — ED Notes (Signed)
Pt c/o cut on rt big toe.  Looks like ingrown toenail.

## 2013-11-05 NOTE — ED Provider Notes (Signed)
Medical screening examination/treatment/procedure(s) were conducted as a shared visit with non-physician practitioner(s) and myself.  I personally evaluated the patient during the encounter.  EKG Interpretation   None      Patient ingrown toenail. Removed.  Juliet RudeNathan R. Rubin PayorPickering, MD 11/05/13 0005

## 2014-05-01 ENCOUNTER — Encounter (HOSPITAL_COMMUNITY): Payer: Self-pay | Admitting: Emergency Medicine

## 2014-05-01 ENCOUNTER — Emergency Department (HOSPITAL_COMMUNITY)
Admission: EM | Admit: 2014-05-01 | Discharge: 2014-05-01 | Disposition: A | Discharge status: Home / Self Care | Payer: Worker's Compensation | Attending: Emergency Medicine | Admitting: Emergency Medicine

## 2014-05-01 ENCOUNTER — Emergency Department (HOSPITAL_COMMUNITY): Payer: Worker's Compensation

## 2014-05-01 DIAGNOSIS — S8990XA Unspecified injury of unspecified lower leg, initial encounter: Secondary | ICD-10-CM | POA: Diagnosis present

## 2014-05-01 DIAGNOSIS — F17200 Nicotine dependence, unspecified, uncomplicated: Secondary | ICD-10-CM | POA: Diagnosis not present

## 2014-05-01 DIAGNOSIS — Y9389 Activity, other specified: Secondary | ICD-10-CM | POA: Insufficient documentation

## 2014-05-01 DIAGNOSIS — X503XXA Overexertion from repetitive movements, initial encounter: Secondary | ICD-10-CM | POA: Insufficient documentation

## 2014-05-01 DIAGNOSIS — Y9289 Other specified places as the place of occurrence of the external cause: Secondary | ICD-10-CM | POA: Diagnosis not present

## 2014-05-01 DIAGNOSIS — J45909 Unspecified asthma, uncomplicated: Secondary | ICD-10-CM | POA: Insufficient documentation

## 2014-05-01 DIAGNOSIS — S99919A Unspecified injury of unspecified ankle, initial encounter: Secondary | ICD-10-CM | POA: Diagnosis present

## 2014-05-01 DIAGNOSIS — X500XXA Overexertion from strenuous movement or load, initial encounter: Secondary | ICD-10-CM | POA: Insufficient documentation

## 2014-05-01 DIAGNOSIS — S99929A Unspecified injury of unspecified foot, initial encounter: Principal | ICD-10-CM

## 2014-05-01 DIAGNOSIS — Z792 Long term (current) use of antibiotics: Secondary | ICD-10-CM | POA: Insufficient documentation

## 2014-05-01 DIAGNOSIS — Z9889 Other specified postprocedural states: Secondary | ICD-10-CM | POA: Diagnosis not present

## 2014-05-01 DIAGNOSIS — Z862 Personal history of diseases of the blood and blood-forming organs and certain disorders involving the immune mechanism: Secondary | ICD-10-CM | POA: Diagnosis not present

## 2014-05-01 DIAGNOSIS — E669 Obesity, unspecified: Secondary | ICD-10-CM | POA: Insufficient documentation

## 2014-05-01 DIAGNOSIS — M25562 Pain in left knee: Secondary | ICD-10-CM

## 2014-05-01 MED ORDER — IBUPROFEN 800 MG PO TABS: 800.0000 mg | ORAL_TABLET | Freq: Three times a day (TID) | ORAL | Status: DC

## 2014-05-01 MED ORDER — TRAMADOL HCL 50 MG PO TABS: 50.0000 mg | ORAL_TABLET | Freq: Four times a day (QID) | ORAL | Status: DC | PRN

## 2014-05-01 NOTE — Discharge Instructions (Signed)
Call an orthopedic specialist for further evaluation of your knee pain and injury. Call for a follow up appointment with a Family or Primary Care Provider.  Return if Symptoms worsen.   Take medication as prescribed.    Emergency Department Resource Guide 1) Find a Doctor and Pay Out of Pocket Although you won't have to find out who is covered by your insurance plan, it is a good idea to ask around and get recommendations. You will then need to call the office and see if the doctor you have chosen will accept you as a new patient and what types of options they offer for patients who are self-pay. Some doctors offer discounts or will set up payment plans for their patients who do not have insurance, but you will need to ask so you aren't surprised when you get to your appointment.  2) Contact Your Local Health Department Not all health departments have doctors that can see patients for sick visits, but many do, so it is worth a call to see if yours does. If you don't know where your local health department is, you can check in your phone book. The CDC also has a tool to help you locate your state's health department, and many state websites also have listings of all of their local health departments.  3) Find a Walk-in Clinic If your illness is not likely to be very severe or complicated, you may want to try a walk in clinic. These are popping up all over the country in pharmacies, drugstores, and shopping centers. They're usually staffed by nurse practitioners or physician assistants that have been trained to treat common illnesses and complaints. They're usually fairly quick and inexpensive. However, if you have serious medical issues or chronic medical problems, these are probably not your best option.  No Primary Care Doctor: - Call Health Connect at  [phone] - they can help you locate a primary care doctor that  accepts your insurance, provides certain services, etc. - Physician Referral  Service- [phone]  Chronic Pain Problems: Organization         Address  Phone   Notes  [facility]  [phone] Patients need to be referred by their primary care doctor.   Medication Assistance: Organization         Address  Phone   Notes  [facility] Medication [facility] Assistance Program [address] [place], [place] [address] [phone] --Must be a resident of [place] -- Must have NO insurance coverage whatsoever (no Medicaid/ Medicare, etc.) -- The pt. MUST have a primary care doctor that directs their care regularly and follows them in the community   MedAssist  [phone]   [employer]  [phone]    Agencies that provide inexpensive medical care: Organization         Address  Phone   Notes  [facility]  [phone]   [facility]    [phone]   [facility] [address] [place], [place] [address] [phone]   [facility] [address], [place] [phone]   Planned Parenthood    [phone]   [facility]    [phone]   [facility]  [address], [place] Phone:  [phone], Fax:  [phone] Hours of Operation:  9 am - 6 pm, M-F.  Also accepts Medicaid/Medicare and self-pay.  [facility] for [place] [address], [address], [place] Phone: [phone], Fax: [phone]. Hours of Operation:  8:30 am - 5:30 pm, M-F.  Also accepts Medicaid and self-pay.  [facility] [place] [address] [place] Phone: [phone]   [place], [place], [place] [phone], Ext. [phone] Mondays & Thursdays: 7-9 AM.  First 15 patients are seen on a first come, first serve basis.    [place] Providers:  Organization         Address  Phone   Notes  [facility] [address],  [phone] Also accepts self-pay patients.  [facility] [address], [place]  [phone]   [place], [address], [place] [phone]   [facility] [address] [place] [phone]   Lucianne Lei [address], [place]   [phone] Only accepts [place] Access [place] patients after they have their name applied to their card.   Self-Pay (no insurance) in [facility]:  Organization         Address  Phone   Notes  Sickle Cell Patients, [facility] [place] [phone]   [facility] [place] [phone]   [facility] [place]  [place], [place], [place] [phone]   [facility]/Dr. Osei-Bonsu  [address], [place] or [address], [place] [phone] Phone number for both [place] and [place] locations is the same.  [facility] [address] [place] [phone]   [facility] [address], [place] or [address] [phone]   [facility] [address], [place] [phone], phone; [phone], fax Sees patients 1st and 3rd Saturday of every month.  Must not qualify for public or private insurance (i.e. Medicaid, Medicare, [place] Health Choice, Veterans' Benefits)  Household income should be no more than 200% of the poverty level The clinic cannot treat you if you are pregnant or think you are pregnant  Sexually transmitted diseases are not treated at the clinic.    Dental Care: Organization         Address  Phone  Notes  [facility] [place] [phone] Accepts children up to age 97 who are enrolled in  [place] or [place]; pregnant women with a Medicaid card; and children who have applied for Medicaid or [place] Health Choice, but were declined, whose parents can pay a reduced fee at time of service.  [facility]  [address], [place] [phone] Accepts children up to age 19 who are enrolled in [place] or [place]; pregnant women with a Medicaid card; and children who have applied for Medicaid or [place] Health Choice, but were declined, whose parents can pay a reduced fee at time of service.  [facility] Access PROGRAM  [place] [phone] Patients are seen by appointment only. Walk-ins are not accepted. [place] will see patients 69 years of age and older. Monday - Tuesday (8am-5pm) Most Wednesdays (8:30-5pm) $30 per  visit, cash only  [facility] Adult [employer] PROGRAM  [address], [address] [facility] [phone] Patients are seen by appointment only. Walk-ins are not accepted. [place] will see patients 48 years of age and older. One Wednesday Evening (Monthly: Volunteer Based).  $30 per visit, cash only  [place]  [phone] for adults; Children under age 62, call Graduate Pediatric Dentistry at [phone]. Children aged 41-14, please call [phone] to request a pediatric application.  Dental services are provided in all areas of dental care including fillings, crowns and bridges, complete and partial dentures, implants, gum treatment, root canals, and extractions. Preventive care is also provided. Treatment is provided to both adults and children. Patients are selected via a lottery and there is often a waiting list.   [facility] [address] [place]  [phone] [email or web address]   [facility] [address] [place], [place] [phone], Ext. [phone] Second and Fourth Thursday of each month, opens at 6:30  AM; Clinic ends at 9 AM.  Patients are seen on a first-come first-served basis, and a limited number are seen during each clinic.   [facility]  [address] Hillard Danker [place], [place] [phone]   Eligibility Requirements You must have lived in [place], [place], or [place] counties for at least the last three months.   You cannot be eligible for state or federal sponsored [employer], including [employer], [place], or [employer].   You generally cannot be eligible for healthcare insurance through your employer.    How to apply: Eligibility screenings are held every Tuesday and Wednesday afternoon from 1:00 pm until 4:00 pm. You do not need an appointment for the interview!  [facility] [address], [place], [place]   [place]  [place] Department  [place]  [phone]    Behavioral Health Resources in the Community: Intensive Outpatient Programs Organization         Address  Phone  Notes  Ecru Dyer. [address] [place], [place] [phone]   [facility] Outpatient [address] [place], [place]   ADS: Alcohol & Drug Svcs [address] [place], [place]   [place] [address]  [place], [place] or [phone]   Substance Abuse Resources Organization         Address  Phone  Notes  Alcohol and Drug Services  [phone]   [place]  [phone]   [facility] [place]   Chinita Pester  [phone]   Residential & Outpatient Substance Abuse Program  [phone]   Psychological Services Organization         Address  Phone  Notes  [facility] [place]  [place]  [phone]   [place] [address] [place] or  [phone]    Mobile Crisis Teams Organization         Address  Phone  Notes  [facility], Mobile Crisis Care Unit  [phone]   [facility]  [address] [place], [place]   [place] [address], [place] [phone]    Self-Help/Support Groups Organization         Address  Phone  Notes  [facility] Assoc[facility] of [place] - variety of support groups  [place] Call for more information  Narcotics Anonymous (NA), [facility]aring [facility] [address], [employer] [place]  2 meetings at this location   Special educational needs teacher         Address  Phone  Notes  AS[facility] Residential Treatment [place],    [place]  [phone]   [facility]  [address] [place] [id number], [place], [place]   [facility] [place], [place] [phone] Admissions: 8am-3pm M-F  Incentives Substance [place] [address]    [place], [place] [phone]   [facility] [address] [place], [place], [place]   [facility] [address],  [place], [place]   [facility] - Intensive Outpatient [address] Kristeen Mans [address], [place], [place]   [facility] ([place].) [address].,  [place], [place] [phone] or [phone]   [facility] (RTS) [address], [place], [place] Accepts Medicaid  [facility] [place] [address]  [place] 1-8254049705 Substance Abuse/Addiction Treatment   [facility] Organization         Address  Phone  Notes  [facility]  [phone]   Domenic Schwab, PhD [address] Arlis Porta [place], [place]   [phone] or [phone]   [place], [place] [phone]   [facility] [address],  [place], [place] [phone] Insurance/Medicaid/sponsorship through [facility] and Families [address] [place]                                    [place], [place] [phone] [place] [address] [place], [place] [phone]    Dr. Adele Schilder  [phone]   [facility] Dept. 1) [address] [place] 2) [place] 3)  [place] [address], [place] [phone]  [phone]   [place] [phone] or [phone] (After Hours)

## 2014-05-01 NOTE — ED Provider Notes (Signed)
CSN: [id number]     Arrival date & time 05/01/14  1848 History  This chart was scribed for non-physician practitioner working with Hurman Horn, MD by Elveria Rising, ED Scribe. This patient was seen in room TR05C/TR05C and the patient's care was started at 8:14 PM.   Chief Complaint  Patient presents with  . Knee Pain     HPI Comments: [name] [name] is a 28 y.o. Male with history of obesity and knee pain who presents to the Emergency Department complaining of left knee pain, onset today. Patient reports pushing a woman in a wheelchair up an inclined driveway and hearing his left knee pop. Patient reports persistent pain since. Pain exacerbated with movement, specifically ascending stairs.  Patient has not taken any pain medication.   Orthopedist: Patient reports that his first knee surgery was performed at [facility]. He uncertain about the remaining surgeries.    The history is provided by the patient. No language interpreter was used.    Past Medical History  Diagnosis Date  . Obesity   . Asthma   . Headache(784.0)   . Bleeding disorder     pt reports hospitalized in 2013 for bleeding disorder   Past Surgical History  Procedure Laterality Date  . Knee surgery    . Arthroscopic repair acl     Family History  Problem Relation Age of Onset  . Diabetes Mother   . Hypertension Mother   . Hypertension Father   . Diabetes Father    History  Substance Use Topics  . Smoking status: Current Every Day Smoker -- 0.50 packs/day    Types: Cigarettes  . Smokeless tobacco: Not on file  . Alcohol Use: Yes     Comment: OCCASIONAL     Review of Systems  Constitutional: Negative for fever and chills.  Gastrointestinal: Negative for nausea, vomiting and diarrhea.  Genitourinary: Negative for dysuria.  Musculoskeletal: Positive for arthralgias. Negative for joint swelling.  Skin: Negative for color change and wound.  Neurological: Negative for weakness and numbness.       Allergies  Review of patient's allergies indicates no known allergies.  Home Medications   Prior to Admission medications   Medication Sig Start Date End Date Taking? Authorizing Provider  diphenhydramine-acetaminophen (TYLENOL PM) 25-500 MG TABS Take 1 tablet by mouth at bedtime as needed (sleep).   Yes Historical Provider, MD  cephALEXin (KEFLEX) 500 MG capsule Take 1 capsule (500 mg total) by mouth 4 (four) times daily. 11/04/13   [name], PA-C   Triage Vitals: BP 154/88  Pulse 92  Temp(Src) 98.1 F (36.7 C) (Oral)  Resp 16  Ht 5\' 10"  (1.778 m)  Wt 375 lb (170.099 kg)  BMI 53.81 kg/m2  SpO2 98% Physical Exam  Nursing note and vitals reviewed. Constitutional: He is oriented to person, place, and time. He appears well-developed and well-nourished. He is cooperative.  Non-toxic appearance. He does not have a sickly appearance. He does not appear ill. No distress.  Obese male  HENT:  Head: Normocephalic and atraumatic.  Eyes: EOM are normal. Pupils are equal, round, and reactive to light.  Neck: Normal range of motion. Neck supple.  Pulmonary/Chest: Effort normal. No respiratory distress.  Musculoskeletal: Normal range of motion. He exhibits tenderness.       Left knee: He exhibits normal range of motion, no swelling, no effusion, no ecchymosis, no deformity and no LCL laxity. Tenderness found. Lateral joint line tenderness noted. No MCL tenderness noted.  Legs: Moderate tenderness to palpation of Left lateral joint line.  Patient is able to bear weight. No crepitus. Good strength to lower extremity. Good range of motion.  Neurological: He is alert and oriented to person, place, and time.  Skin: Skin is warm and dry. He is not diaphoretic.  Psychiatric: He has a normal mood and affect. His behavior is normal.    ED Course  Procedures (including critical care time) COORDINATION OF CARE: 8:22 PM- Patient advised to follow up with his orthopedist. Discussed  treatment plan with patient at bedside and patient agreed to plan.    Labs Review Labs Reviewed - No data to display  Imaging Review Dg Knee Complete 4 Views Left  05/01/2014   CLINICAL DATA:  Left knee pain  EXAM: LEFT KNEE - COMPLETE 4+ VIEW  COMPARISON:  05/31/2008  FINDINGS: No acute fracture or dislocation. No lytic or sclerotic osseous lesion. Tricompartmental osteoarthritis of the left knee. Prior ACL repair. No significant joint effusion.  IMPRESSION: Tricompartmental osteoarthritis of the left knee.   Electronically Signed   By: Elige KoHetal  Patel   On: 05/01/2014 19:56     EKG Interpretation None      MDM   Final diagnoses:  Left knee pain   Patient presents with left knee injury, no sign of infection, no joint effusion. Reports to previous surgeries on left knee 1 performed by Delbert HarnessMurphy Wainer second unknown surgeon. X-ray shows arthritic changes of the left knee. Plan to discharge with RICE, NSAIDs and ortho follow up. Discussed imaging results, and treatment plan with the patient. Return precautions given. Reports understanding and no other concerns at this time.  Patient is stable for discharge at this time.  Meds given in ED:  Medications - No data to display  New Prescriptions   IBUPROFEN (ADVIL,MOTRIN) 800 MG TABLET    Take 1 tablet (800 mg total) by mouth 3 (three) times daily with meals.   TRAMADOL (ULTRAM) 50 MG TABLET    Take 1 tablet (50 mg total) by mouth every 6 (six) hours as needed.    I personally performed the services described in this documentation, which was scribed in my presence. The recorded information has been reviewed and is accurate.    Clabe SealLauren M Ferrin Liebig, PA-C 05/01/14 2031

## 2014-05-01 NOTE — ED Notes (Signed)
Patient transported to X-ray 

## 2014-05-01 NOTE — ED Notes (Signed)
He states " i was pushing a lady in a wheelchair up a hill and felt a twist and pain in my left knee. Its been hurting since."

## 2014-05-02 NOTE — ED Provider Notes (Signed)
Medical screening examination/treatment/procedure(s) were performed by non-physician practitioner and as supervising physician I was immediately available for consultation/collaboration.   EKG Interpretation None       Shaynah Hund M Kaveh Kissinger, MD 05/02/14 1335 

## 2014-05-05 ENCOUNTER — Emergency Department (HOSPITAL_COMMUNITY)
Admission: EM | Admit: 2014-05-05 | Discharge: 2014-05-05 | Disposition: A | Discharge status: Home / Self Care | Payer: BC Managed Care – PPO | Attending: Emergency Medicine | Admitting: Emergency Medicine

## 2014-05-05 ENCOUNTER — Emergency Department (HOSPITAL_COMMUNITY): Payer: BC Managed Care – PPO

## 2014-05-05 ENCOUNTER — Encounter (HOSPITAL_COMMUNITY): Payer: Self-pay | Admitting: Emergency Medicine

## 2014-05-05 DIAGNOSIS — E669 Obesity, unspecified: Secondary | ICD-10-CM | POA: Insufficient documentation

## 2014-05-05 DIAGNOSIS — Z791 Long term (current) use of non-steroidal anti-inflammatories (NSAID): Secondary | ICD-10-CM | POA: Insufficient documentation

## 2014-05-05 DIAGNOSIS — Z862 Personal history of diseases of the blood and blood-forming organs and certain disorders involving the immune mechanism: Secondary | ICD-10-CM | POA: Insufficient documentation

## 2014-05-05 DIAGNOSIS — J45909 Unspecified asthma, uncomplicated: Secondary | ICD-10-CM | POA: Insufficient documentation

## 2014-05-05 DIAGNOSIS — F17200 Nicotine dependence, unspecified, uncomplicated: Secondary | ICD-10-CM | POA: Insufficient documentation

## 2014-05-05 DIAGNOSIS — R042 Hemoptysis: Secondary | ICD-10-CM | POA: Insufficient documentation

## 2014-05-05 LAB — I-STAT CHEM 8, ED
BUN: 8 mg/dL (ref 6–23)
CALCIUM ION: 1.22 mmol/L (ref 1.12–1.23)
CHLORIDE: 101 meq/L (ref 96–112)
Creatinine, Ser: 1 mg/dL (ref 0.50–1.35)
Glucose, Bld: 120 mg/dL — ABNORMAL HIGH (ref 70–99)
HEMATOCRIT: 47 % (ref 39.0–52.0)
Hemoglobin: 16 g/dL (ref 13.0–17.0)
POTASSIUM: 4 meq/L (ref 3.7–5.3)
SODIUM: 141 meq/L (ref 137–147)
TCO2: 29 mmol/L (ref 0–100)

## 2014-05-05 NOTE — Discharge Instructions (Signed)
Hemoptysis  Hemoptysis, which means coughing up blood, can be a sign of a minor problem or a serious medical condition. The blood that is coughed up may come from the lungs and airways. Coughed-up blood can also come from bleeding that occurs outside the lungs and airways. Blood can drain into the windpipe during a severe nosebleed or when blood is vomited from the stomach. Because hemoptysis can be a sign of something serious, a medical evaluation is required. For some people with hemoptysis, no definite cause is ever identified.  CAUSES   The most common cause of hemoptysis is bronchitis. Some other common causes include:    A ruptured blood vessel caused by coughing or an infection.    A medical condition that causes damage to the large air passageways (bronchiectasis).    A blood clot in the lungs (pulmonary embolism).    Pneumonia.    Tuberculosis.    Breathing in a small foreign object.    Cancer.  For some people with hemoptysis, no definite cause is ever identified.   HOME CARE INSTRUCTIONS   Only take over-the-counter or prescription medicines as directed by your caregiver. Do not use cough suppressants unless your caregiver approves.   If your caregiver prescribes antibiotic medicines, take them as directed. Finish them even if you start to feel better.   Do not smoke. Also avoid secondhand smoke.   Follow up with your caregiver as directed.  SEEK IMMEDIATE MEDICAL CARE IF:    You cough up bloody mucus for longer than a week.   You have a blood-producing cough that is severe or getting worse.   You have a blood-producing cough thatcomes and goes over time.   You develop problems with your breathing.    You vomit blood.   You develop bloody or black-colored stools.   You have chest pain.    You develop night sweats.   You feel faint or pass out.    You have a fever or persistent symptoms for more than 2-3 days.   You have a fever and your symptoms suddenly get worse.  MAKE  SURE YOU:   Understand these instructions.   Will watch your condition.   Will get help right away if you are not doing well or get worse.  Document Released: 12/10/2001 Document Revised: 09/17/2012 Document Reviewed: 07/18/2012  [employer] Patient Information 2015 [employer]. This information is not intended to replace advice given to you by your health care provider. Make sure you discuss any questions you have with your health care provider.

## 2014-05-05 NOTE — ED Notes (Signed)
Pt states that he is starting feeling tingling in his throat and feeling short of breath and then he spit up blood. Pt states it only happened the one time. Pt in NAD.

## 2014-05-05 NOTE — ED Provider Notes (Signed)
CSN: [id number]     Arrival date & time 05/05/14  1850 History   First MD Initiated Contact with Patient 05/05/14 1926     Chief Complaint  Patient presents with  . Spitting up Blood      (Consider location/radiation/quality/duration/timing/severity/associated sxs/prior Treatment) HPI Comments: 28 year old male who presents with hemoptysis which occurred prior to arrival. He had 2-3 episodes of scant amount of dark red hemoptysis at home. He noted bleeding shortness of breath but denies this now. He denies any pain. He is currently asymptomatic. He did cough up a small amount of dark blood here. He states that he is otherwise been well and denies any cough, fever, vomiting, or diarrhea. He had a similar episode 5 years ago spontaneously. He is a heavy smoker. The severity of his issue is mild. The duration is intermittent. He denies any other associated symptoms. Nothing has relieved his symptoms thus far.  The history is provided by the patient.    Past Medical History  Diagnosis Date  . Obesity   . Asthma   . Headache(784.0)   . Bleeding disorder     pt reports hospitalized in 2013 for bleeding disorder   Past Surgical History  Procedure Laterality Date  . Knee surgery    . Arthroscopic repair acl     Family History  Problem Relation Age of Onset  . Diabetes Mother   . Hypertension Mother   . Hypertension Father   . Diabetes Father    History  Substance Use Topics  . Smoking status: Current Every Day Smoker -- 0.75 packs/day    Types: Cigarettes  . Smokeless tobacco: Never Used  . Alcohol Use: Yes     Comment: OCCASIONAL     Review of Systems  Constitutional: Negative for fever.  HENT: Negative for drooling and rhinorrhea.   Eyes: Negative for pain.  Respiratory: Negative for cough and shortness of breath.   Cardiovascular: Negative for chest pain and leg swelling.  Gastrointestinal: Negative for nausea, vomiting, abdominal pain and diarrhea.  Genitourinary:  Negative for dysuria and hematuria.  Musculoskeletal: Negative for gait problem and neck pain.  Skin: Negative for color change.  Neurological: Negative for numbness and headaches.  Hematological: Negative for adenopathy.  Psychiatric/Behavioral: Negative for behavioral problems.  All other systems reviewed and are negative.     Allergies  Review of patient's allergies indicates no known allergies.  Home Medications   Prior to Admission medications   Medication Sig Start Date End Date Taking? Authorizing Provider  ibuprofen (ADVIL,MOTRIN) 800 MG tablet Take 1 tablet (800 mg total) by mouth 3 (three) times daily with meals. 05/01/14  Yes Lauren Doretha Imus, PA-C  traMADol (ULTRAM) 50 MG tablet Take 50 mg by mouth every 6 (six) hours as needed for moderate pain.   Yes Historical Provider, MD   BP 150/88  Pulse 92  Temp(Src) 98 F (36.7 C) (Oral)  Resp 18  SpO2 95% Physical Exam  Nursing note and vitals reviewed. Constitutional: He is oriented to person, place, and time. He appears well-developed and well-nourished.  HENT:  Head: Normocephalic and atraumatic.  Right Ear: External ear normal.  Left Ear: External ear normal.  Nose: Nose normal.  Mouth/Throat: Oropharynx is clear and moist. No oropharyngeal exudate.  Eyes: Conjunctivae and EOM are normal. Pupils are equal, round, and reactive to light.  Neck: Normal range of motion. Neck supple.  Cardiovascular: Normal rate, regular rhythm, normal heart sounds and intact distal pulses.  Exam reveals no gallop  and no friction rub.   No murmur heard. Pulmonary/Chest: Effort normal and breath sounds normal. No respiratory distress. He has no wheezes.  Abdominal: Soft. Bowel sounds are normal. He exhibits no distension. There is no tenderness. There is no rebound and no guarding.  Musculoskeletal: Normal range of motion. He exhibits no edema and no tenderness.  Neurological: He is alert and oriented to person, place, and time.  Skin:  Skin is warm and dry.  Psychiatric: He has a normal mood and affect. His behavior is normal.    ED Course  Procedures (including critical care time) Labs Review Labs Reviewed  I-STAT CHEM 8, ED - Abnormal; Notable for the following:    Glucose, Bld 120 (*)    All other components within normal limits    Imaging Review Dg Chest 2 View  05/05/2014   CLINICAL DATA:  Coughing up blood beginning today.  EXAM: CHEST  2 VIEW  COMPARISON:  PA and lateral chest 06/28/2006.  FINDINGS: Heart size and mediastinal contours are within normal limits. Both lungs are clear. Visualized skeletal structures are unremarkable.  IMPRESSION: Negative exam.   Electronically Signed   By: Drusilla Kannerhomas  Dalessio M.D.   On: 05/05/2014 20:12     EKG Interpretation   Date/Time:  Wednesday May 05 2014 20:10:51 EDT Ventricular Rate:  77 PR Interval:  163 QRS Duration: 92 QT Interval:  370 QTC Calculation: 419 R Axis:   81 Text Interpretation:  Sinus rhythm Confirmed by Selia Wareing  MD, Justine Cossin  ([id number]) on 05/05/2014 8:34:04 PM      MDM   Final diagnoses:  Hemoptysis    8:12 PM 28 y.o. male here w/ hemoptysis which began pta. Low risk wells and perc neg. Similar sx 5 years ago. Pt is heavy smoker, likely related to this as pt is otherwise asx.   8:54 PM: No further hemoptysis here. Pt well appearing. I interpreted/reviewed the labs and/or imaging which were non-contributory.   I have discussed the diagnosis/risks/treatment options with the patient and family and believe the pt to be eligible for discharge home to follow-up with pcp as needed. We also discussed returning to the ED immediately if new or worsening sx occur. We discussed the sx which are most concerning (e.g., worsening hemoptysis, hematemesis, sob, cp, blood in stool) that necessitate immediate return. Medications administered to the patient during their visit and any new prescriptions provided to the patient are listed below.  Medications given during  this visit Medications - No data to display  New Prescriptions   No medications on file       Junius ArgyleForrest S Govanni Plemons, MD 05/05/14 2056

## 2014-10-28 ENCOUNTER — Encounter: Payer: Self-pay | Admitting: Family

## 2014-10-28 ENCOUNTER — Ambulatory Visit (INDEPENDENT_AMBULATORY_CARE_PROVIDER_SITE_OTHER): Payer: BLUE CROSS/BLUE SHIELD | Admitting: Family

## 2014-10-28 VITALS — BP 124/82 | HR 85 | Temp 98.1°F | Resp 18 | Ht 71.0 in | Wt >= 6400 oz

## 2014-10-28 DIAGNOSIS — R079 Chest pain, unspecified: Secondary | ICD-10-CM

## 2014-10-28 DIAGNOSIS — G43909 Migraine, unspecified, not intractable, without status migrainosus: Secondary | ICD-10-CM | POA: Insufficient documentation

## 2014-10-28 DIAGNOSIS — G43809 Other migraine, not intractable, without status migrainosus: Secondary | ICD-10-CM

## 2014-10-28 MED ORDER — SUMATRIPTAN SUCCINATE 100 MG PO TABS: ORAL_TABLET | ORAL | Status: DC

## 2014-10-28 MED ORDER — PROMETHAZINE HCL 12.5 MG PO TABS: 12.5000 mg | ORAL_TABLET | Freq: Three times a day (TID) | ORAL | Status: DC | PRN

## 2014-10-28 NOTE — Progress Notes (Signed)
Subjective:    Patient ID: [name] [name], male    DOB: September 23, 1986, 29 y.o.   MRN: [id number]   Chief Complaint  Patient presents with  . Establish Care    frequent headache and chest pain, family history of diabetes     HPI:  [name] Gamesdward Hemstreet is a 29 y.o. male who presents today to establish care.   1) Headaches - Chronic headaches started in childhood; pains are described as sharp and knocking, sensitivity to light or sound, occasional nausea without vomiting; has taken Excedrine-migraine and Tylenol-PM which has provide some relief. Improves with sleep. Indicates approximately 2-3 headaches per week.  2) Chest pain - sharp chest pain that has been going on for a couple of months in a sporadic nature. Denies chest pain with exertion or radiating pains when it happens. Denies edema, palpitations. Denies any reflux or cough. Not currently experiencing chest pain.   No Known Allergies   No current outpatient prescriptions on file prior to visit.   No current facility-administered medications on file prior to visit.    Past Medical History  Diagnosis Date  . Obesity   . Asthma   . Headache(784.0)   . Bleeding disorder     pt reports hospitalized in 2013 for bleeding disorder  . Chicken pox   . Generalized headaches   . Genital warts     Past Surgical History  Procedure Laterality Date  . Knee surgery    . Arthroscopic repair acl      Family History  Problem Relation Age of Onset  . Hypertension Mother   . Arthritis Mother   . Heart disease Mother   . Drug abuse Mother     cocaine  . Diabetes Father   . Heart disease Father   . Drug abuse Father     cocaine  . Heart disease Maternal Grandmother   . Diabetes Paternal Grandmother   . Diabetes Paternal Grandfather     History   Social History  . Marital Status: Single    Spouse Name: N/A    Number of Children: 1  . Years of Education: 13   Occupational History  . Bus Driver    Social History Main  Topics  . Smoking status: Current Every Day Smoker -- 0.33 packs/day for 14 years    Types: Cigarettes  . Smokeless tobacco: Never Used  . Alcohol Use: Yes     Comment: OCCASIONAL   . Drug Use: No  . Sexual Activity: Not Currently   Other Topics Concern  . Not on file   Social History Narrative   Born [place] raised [place], [place] Currently resides in an apartment by himself. No pets. Fun: Gamble and phone games, travel.   Denies religious beliefs effecting health care.      Review of Systems  Constitutional: Positive for fatigue.  Respiratory: Negative for chest tightness and shortness of breath.   Cardiovascular: Negative for chest pain, palpitations and leg swelling.  Endocrine: Positive for polyphagia. Negative for polydipsia and polyuria.      Objective:    BP 124/82 mmHg  Pulse 85  Temp(Src) 98.1 F (36.7 C) (Oral)  Resp 18  Ht 5\' 11"  (1.803 m)  Wt 401 lb (181.892 kg)  BMI 55.95 kg/m2  SpO2 97% Nursing note and vital signs reviewed.  Physical Exam  Constitutional: He is oriented to person, place, and time. He appears well-developed and well-nourished. No distress.  Morbidly obese gentlemen seated in the chair, dressed appropriately  for the situation, and appears his stated age.   Cardiovascular: Normal rate, regular rhythm, normal heart sounds and intact distal pulses.   Pulmonary/Chest: Effort normal and breath sounds normal.  Neurological: He is alert and oriented to person, place, and time. No cranial nerve deficit. He exhibits normal muscle tone. Coordination normal.  Skin: Skin is warm and dry.  Psychiatric: He has a normal mood and affect. His behavior is normal. Judgment and thought content normal.      Assessment & Plan:

## 2014-10-28 NOTE — Assessment & Plan Note (Signed)
Neurological exam is normal. Symptoms consistent with migraine headache. Start Imitrex. Start Phenergan as needed for nausea. Patient started to keep track of headaches and follow up after several headaches to determine effectiveness of plan. Instructed to seek emergency medical care if headache develops feels like worse headache of his life.

## 2014-10-28 NOTE — Assessment & Plan Note (Signed)
Patient's BMI is 55 - this places him in the morbidly obese category. Goal would be to lose 5-10% of his body weight to start out with. Discussed increasing nutrient density of his diet will decreasing saturated fats. Emphasize importance of physical activity 30 minutes most days.

## 2014-10-28 NOTE — Patient Instructions (Signed)
Thank you for choosing [facility].  Summary/Instructions:  Please schedule a time for your physical.   Do not take the phenergan at work. Take the imitrex as prescribed.   Your prescription(s) have been submitted to your pharmacy or been printed and provided for you. Please take as directed and contact our office if you believe you are having problem(s) with the medication(s) or have any questions.  If your symptoms worsen or fail to improve, please contact our office for further instruction, or in case of emergency go directly to the emergency room at the closest medical facility.    Migraine Headache A migraine headache is an intense, throbbing pain on one or both sides of your head. A migraine can last for 30 minutes to several hours. CAUSES  The exact cause of a migraine headache is not always known. However, a migraine may be caused when nerves in the brain become irritated and release chemicals that cause inflammation. This causes pain. Certain things may also trigger migraines, such as:  Alcohol.  Smoking.  Stress.  Menstruation.  Aged cheeses.  Foods or drinks that contain nitrates, glutamate, aspartame, or tyramine.  Lack of sleep.  Chocolate.  Caffeine.  Hunger.  Physical exertion.  Fatigue.  Medicines used to treat chest pain (nitroglycerine), birth control pills, estrogen, and some blood pressure medicines. SIGNS AND SYMPTOMS  Pain on one or both sides of your head.  Pulsating or throbbing pain.  Severe pain that prevents daily activities.  Pain that is aggravated by any physical activity.  Nausea, vomiting, or both.  Dizziness.  Pain with exposure to bright lights, loud noises, or activity.  General sensitivity to bright lights, loud noises, or smells. Before you get a migraine, you may get warning signs that a migraine is coming (aura). An aura may include:  Seeing flashing lights.  Seeing bright spots, halos, or zigzag  lines.  Having tunnel vision or blurred vision.  Having feelings of numbness or tingling.  Having trouble talking.  Having muscle weakness. DIAGNOSIS  A migraine headache is often diagnosed based on:  Symptoms.  Physical exam.  A CT scan or MRI of your head. These imaging tests cannot diagnose migraines, but they can help rule out other causes of headaches. TREATMENT Medicines may be given for pain and nausea. Medicines can also be given to help prevent recurrent migraines.  HOME CARE INSTRUCTIONS  Only take over-the-counter or prescription medicines for pain or discomfort as directed by your health care provider. The use of long-term narcotics is not recommended.  Lie down in a dark, quiet room when you have a migraine.  Keep a journal to find out what may trigger your migraine headaches. For example, write down:  What you eat and drink.  How much sleep you get.  Any change to your diet or medicines.  Limit alcohol consumption.  Quit smoking if you smoke.  Get 7-9 hours of sleep, or as recommended by your health care provider.  Limit stress.  Keep lights dim if bright lights bother you and make your migraines worse. SEEK IMMEDIATE MEDICAL CARE IF:   Your migraine becomes severe.  You have a fever.  You have a stiff neck.  You have vision loss.  You have muscular weakness or loss of muscle control.  You start losing your balance or have trouble walking.  You feel faint or pass out.  You have severe symptoms that are different from your first symptoms. MAKE SURE YOU:   Understand these instructions.  Will watch your condition.  Will get help right away if you are not doing well or get worse. Document Released: 10/01/2005 Document Revised: 02/15/2014 Document Reviewed: 06/08/2013 [facility] Patient Information 2015 [place], [place]. This information is not intended to replace advice given to you by your health care provider. Make sure you discuss any  questions you have with your health care provider.

## 2014-10-28 NOTE — Progress Notes (Signed)
Pre visit review using our clinic review tool, if applicable. No additional management support is needed unless otherwise documented below in the visit note. 

## 2014-10-28 NOTE — Assessment & Plan Note (Addendum)
Obtain in office EKG to rule out cardiac causes. Results of the EKG showed normal sinus rhythm. Cannot rule out GERD as a potential cause of chest pain. Patient instructed to monitor chest pain and events that may lead up to it. Given no exertional chest pain or shortness of breath or chest tightness doubt cardiac origin. Follow-up if symptoms worsen or fail to improve.

## 2014-10-29 ENCOUNTER — Telehealth: Payer: Self-pay | Admitting: Family

## 2014-10-29 NOTE — Telephone Encounter (Signed)
emmi mailed  °

## 2014-10-31 ENCOUNTER — Encounter: Payer: Self-pay | Admitting: Family

## 2014-11-01 ENCOUNTER — Telehealth: Payer: Self-pay

## 2014-11-01 NOTE — Telephone Encounter (Signed)
Called pt to let him know that his work note is ready for pick up. Left VM for pt to call back.

## 2014-11-11 ENCOUNTER — Encounter: Payer: Self-pay | Admitting: Family

## 2014-11-11 ENCOUNTER — Ambulatory Visit (INDEPENDENT_AMBULATORY_CARE_PROVIDER_SITE_OTHER): Payer: BLUE CROSS/BLUE SHIELD | Admitting: Family

## 2014-11-11 VITALS — BP 118/68 | HR 76 | Temp 98.7°F | Resp 18 | Ht 71.0 in | Wt >= 6400 oz

## 2014-11-11 DIAGNOSIS — G4733 Obstructive sleep apnea (adult) (pediatric): Secondary | ICD-10-CM

## 2014-11-11 DIAGNOSIS — R0683 Snoring: Secondary | ICD-10-CM

## 2014-11-11 HISTORY — DX: Obstructive sleep apnea (adult) (pediatric): G47.33

## 2014-11-11 NOTE — Assessment & Plan Note (Signed)
Symptoms consistent with potential sleep apnea. Nocturnal polysomnograph ordered. Information on sleep apnea and sleep study provided to patient. Follow-up if symptoms worsen or fail to improve.

## 2014-11-11 NOTE — Patient Instructions (Signed)
Sleep Apnea  Sleep apnea is a sleep disorder characterized by abnormal pauses in breathing while you sleep. When your breathing pauses, the level of oxygen in your blood decreases. This causes you to move out of deep sleep and into light sleep. As a result, your quality of sleep is poor, and the system that carries your blood throughout your body (cardiovascular system) experiences stress. If sleep apnea remains untreated, the following conditions can develop:  High blood pressure (hypertension).  Coronary artery disease.  Inability to achieve or maintain an erection (impotence).  Impairment of your thought process (cognitive dysfunction). There are three types of sleep apnea: 1. Obstructive sleep apnea--Pauses in breathing during sleep because of a blocked airway. 2. Central sleep apnea--Pauses in breathing during sleep because the area of the brain that controls your breathing does not send the correct signals to the muscles that control breathing. 3. Mixed sleep apnea--A combination of both obstructive and central sleep apnea. RISK FACTORS The following risk factors can increase your risk of developing sleep apnea:  Being overweight.  Smoking.  Having narrow passages in your nose and throat.  Being of older age.  Being male.  Alcohol use.  Sedative and tranquilizer use.  Ethnicity. Among individuals younger than 35 years, African Americans are at increased risk of sleep apnea. SYMPTOMS   Difficulty staying asleep.  Daytime sleepiness and fatigue.  Loss of energy.  Irritability.  Loud, heavy snoring.  Morning headaches.  Trouble concentrating.  Forgetfulness.  Decreased interest in sex. DIAGNOSIS  In order to diagnose sleep apnea, your caregiver will perform a physical examination. Your caregiver may suggest that you take a home sleep test. Your caregiver may also recommend that you spend the night in a sleep lab. In the sleep lab, several monitors record  information about your heart, lungs, and brain while you sleep. Your leg and arm movements and blood oxygen level are also recorded. TREATMENT The following actions may help to resolve mild sleep apnea:  Sleeping on your side.   Using a decongestant if you have nasal congestion.   Avoiding the use of depressants, including alcohol, sedatives, and narcotics.   Losing weight and modifying your diet if you are overweight. There also are devices and treatments to help open your airway:  Oral appliances. These are custom-made mouthpieces that shift your lower jaw forward and slightly open your bite. This opens your airway.  Devices that create positive airway pressure. This positive pressure "splints" your airway open to help you breathe better during sleep. The following devices create positive airway pressure:  Continuous positive airway pressure (CPAP) device. The CPAP device creates a continuous level of air pressure with an air pump. The air is delivered to your airway through a mask while you sleep. This continuous pressure keeps your airway open.  Nasal expiratory positive airway pressure (EPAP) device. The EPAP device creates positive air pressure as you exhale. The device consists of single-use valves, which are inserted into each nostril and held in place by adhesive. The valves create very little resistance when you inhale but create much more resistance when you exhale. That increased resistance creates the positive airway pressure. This positive pressure while you exhale keeps your airway open, making it easier to breath when you inhale again.  Bilevel positive airway pressure (BPAP) device. The BPAP device is used mainly in patients with central sleep apnea. This device is similar to the CPAP device because it also uses an air pump to deliver continuous air pressure   through a mask. However, with the BPAP machine, the pressure is set at two different levels. The pressure when you  exhale is lower than the pressure when you inhale.  Surgery. Typically, surgery is only done if you cannot comply with less invasive treatments or if the less invasive treatments do not improve your condition. Surgery involves removing excess tissue in your airway to create a wider passage way. Document Released: 09/21/2002 Document Revised: 01/26/2013 Document Reviewed: 02/07/2012 [facility] Patient Information 2015 [place] [place]. This information is not intended to replace advice given to you by your health care provider. Make sure you discuss any questions you have with your health care provider.   Polysomnography (Sleep Studies) Polysomnography (PSG) is a series of tests used for detecting (diagnosing) obstructive sleep apnea and other sleep disorders. The tests measure how some parts of your body are working while you are sleeping. The tests are extensive and expensive. They are done in a sleep lab or hospital, and vary from center to center. Your caregiver may perform other more simple sleep studies and questionnaires before doing more complete and involved testing. Testing may not be covered by insurance. Some of these tests are:  An EEG (Electroencephalogram). This tests your brain waves and stages of sleep.  An EOG (Electrooculogram). This measures the movements of your eyes. It detects periods of REM (rapid eye movement) sleep, which is your dream sleep.  An EKG (Electrocardiogram). This measures your heart rhythm.  EMG (Electromyography). This is a measurement of how the muscles are working in your upper airway and your legs while sleeping.  An oximetry measurement. It measures how much oxygen (air) you are getting while sleeping.  Breathing efforts may be measured. The same test can be interpreted (understood) differently by different caregivers and centers that study sleep.  Studies may be given an apnea/hypopnea index (AHI). This is a number which is found by counting the times of  no breathing or under breathing during the night, and relating those numbers to the amount of time spent in bed. When the AHI is greater than 15, the patient is likely to complain of daytime sleepiness. When the AHI is greater than 30, the patient is at increased risk for heart problems and must be followed more closely. Following the AHI also allows you to know how treatment is working. Simple oximetry (tracking the amount of oxygen that is taken in) can be used for screening patients who: 4. Do not have symptoms (problems) of OSA. 5. Have a normal Epworth Sleepiness Scale Score. 6. Have a low pre-test probability of having OSA. 7. Have none of the upper airway problems likely to cause apnea. 8. Oximetry is also used to determine if treatment is effective in patients who showed significant desaturations (not getting enough oxygen) on their home sleep study. One extra measure of safety is to perform additional studies for the person who only snores. This is because no one can predict with absolute certainty who will have OSA. Those who show significant desaturations (not getting enough oxygen) are recommended to have a more detailed sleep study. Document Released: 04/07/2003 Document Revised: 12/24/2011 Document Reviewed: 12/07/2013 [facility] Patient Information 2015 [place], [place]. This information is not intended to replace advice given to you by your health care provider. Make sure you discuss any questions you have with your health care provider.

## 2014-11-11 NOTE — Progress Notes (Signed)
Pre visit review using our clinic review tool, if applicable. No additional management support is needed unless otherwise documented below in the visit note. 

## 2014-11-11 NOTE — Progress Notes (Signed)
   Subjective:    Patient ID: [name] [name], male    DOB: [birth year], 29 y.o.   MRN: [id number]  Chief Complaint  Patient presents with  . Sleep Apnea    would like referral to sleep study     HPI:  [name] [name] is a 29 y.o. male who presents today to discuss a sleep study.   Associated symptoms of snoring, daytime fatigue, and sleepiness during the day. Family indicates that he does indicate he is always tired.  These symptoms have been going on for about a year. The intensity of the symptoms have increased to the point that he has woken up and felt like he couldn't catch his breath. Frequency of these events has increased recently. Denies any at home treatments.   No Known Allergies  Current Outpatient Prescriptions on File Prior to Visit  Medication Sig Dispense Refill  . promethazine (PHENERGAN) 12.5 MG tablet Take 1 tablet (12.5 mg total) by mouth every 8 (eight) hours as needed for nausea or vomiting. 20 tablet 0   No current facility-administered medications on file prior to visit.    Review of Systems  Constitutional: Positive for fatigue.  Psychiatric/Behavioral: Positive for sleep disturbance.      Objective:    BP 118/68 mmHg  Pulse 76  Temp(Src) 98.7 F (37.1 C) (Oral)  Resp 18  Ht 5\' 11"  (1.803 m)  Wt 404 lb 12.8 oz (183.616 kg)  BMI 56.48 kg/m2  SpO2 97% Nursing note and vital signs reviewed.  Physical Exam  Constitutional: He is oriented to person, place, and time. He appears well-developed and well-nourished. No distress.  Morbidly obese male seated on the table in no apparent distress, dressed appropriately, and appears his stated age.  Cardiovascular: Normal rate, regular rhythm, normal heart sounds and intact distal pulses.   Pulmonary/Chest: Effort normal and breath sounds normal.  Neurological: He is alert and oriented to person, place, and time.  Skin: Skin is warm and dry.  Psychiatric: He has a normal mood and affect. His behavior is  normal. Judgment and thought content normal.       Assessment & Plan:

## 2014-11-26 ENCOUNTER — Encounter: Payer: Self-pay | Admitting: Family

## 2014-11-26 ENCOUNTER — Ambulatory Visit (INDEPENDENT_AMBULATORY_CARE_PROVIDER_SITE_OTHER): Payer: BLUE CROSS/BLUE SHIELD | Admitting: Family

## 2014-11-26 ENCOUNTER — Other Ambulatory Visit (INDEPENDENT_AMBULATORY_CARE_PROVIDER_SITE_OTHER): Payer: BLUE CROSS/BLUE SHIELD

## 2014-11-26 VITALS — BP 144/84 | HR 74 | Temp 97.9°F | Resp 18 | Ht 70.0 in | Wt 398.0 lb

## 2014-11-26 DIAGNOSIS — Z0000 Encounter for general adult medical examination without abnormal findings: Secondary | ICD-10-CM | POA: Insufficient documentation

## 2014-11-26 LAB — BASIC METABOLIC PANEL
BUN: 14 mg/dL (ref 6–23)
CHLORIDE: 104 meq/L (ref 96–112)
CO2: 30 mEq/L (ref 19–32)
Calcium: 9.8 mg/dL (ref 8.4–10.5)
Creatinine, Ser: 1.06 mg/dL (ref 0.40–1.50)
GFR: 108.2 mL/min (ref >60.00)
GLUCOSE: 99 mg/dL (ref 70–99)
POTASSIUM: 4.7 meq/L (ref 3.5–5.1)
SODIUM: 138 meq/L (ref 135–145)

## 2014-11-26 LAB — LIPID PANEL
Cholesterol: 116 mg/dL (ref 0–200)
HDL: 25.5 mg/dL — ABNORMAL LOW (ref >39.00)
LDL Cholesterol: 75 mg/dL (ref 0–99)
NONHDL: 90.5
Total CHOL/HDL Ratio: 5
Triglycerides: 80 mg/dL (ref 0.0–149.0)
VLDL: 16 mg/dL (ref 0.0–40.0)

## 2014-11-26 LAB — CBC
HCT: 44.5 % (ref 39.0–52.0)
Hemoglobin: 15.1 g/dL (ref 13.0–17.0)
MCHC: 33.9 g/dL (ref 30.0–36.0)
MCV: 82.7 fl (ref 78.0–100.0)
Platelets: 236 10*3/uL (ref 150.0–400.0)
RBC: 5.38 Mil/uL (ref 4.22–5.81)
RDW: 13.9 % (ref 11.5–15.5)
WBC: 10.5 10*3/uL (ref 4.0–10.5)

## 2014-11-26 LAB — HEMOGLOBIN A1C: Hgb A1c MFr Bld: 5.9 % (ref 4.6–6.5)

## 2014-11-26 LAB — TSH: TSH: 1.17 u[IU]/mL (ref 0.35–4.50)

## 2014-11-26 NOTE — Progress Notes (Signed)
Pre visit review using our clinic review tool, if applicable. No additional management support is needed unless otherwise documented below in the visit note. 

## 2014-11-26 NOTE — Progress Notes (Signed)
Subjective:    Patient ID: [name] GamesEdward [name], male    DOB: Jun 22, 1986, 29 y.o.   MRN: [id number]  Chief Complaint  Patient presents with  . CPE    Fasting    HPI:  [name] [name] is a 29 y.o. male who presents today for an annual wellness visit.   1) Health Maintenance - Overall feeling good; headaches have been controlled.  Diet - Averaging about 4 meals per day with snacks; fruits, lean meats, vegetables; Limiting fried and processed foods; little caffeine.  Exercise - No structured exercise outside of work   2) Preventative Exams / Immunizations:  Dental -- Up to date  Vision -- Up to date   Health Maintenance  Topic Date Due  . TETANUS/TDAP  06/02/2007  . INFLUENZA VACCINE  05/15/2014  tetanus shot; declines flu shot   There is no immunization history on file for this patient.    No Known Allergies   No current outpatient prescriptions on file prior to visit.   No current facility-administered medications on file prior to visit.    Past Medical History  Diagnosis Date  . Obesity   . Asthma   . Headache(784.0)   . Bleeding disorder     pt reports hospitalized in 2013 for bleeding disorder  . Chicken pox   . Generalized headaches   . Genital warts     Past Surgical History  Procedure Laterality Date  . Knee surgery    . Arthroscopic repair acl      Family History  Problem Relation Age of Onset  . Hypertension Mother   . Arthritis Mother   . Heart disease Mother   . Drug abuse Mother     cocaine  . Diabetes Father   . Heart disease Father   . Drug abuse Father     cocaine  . Heart disease Maternal Grandmother   . Diabetes Paternal Grandmother   . Diabetes Paternal Grandfather     History   Social History  . Marital Status: Single    Spouse Name: N/A  . Number of Children: 1  . Years of Education: 13   Occupational History  . Bus Driver    Social History Main Topics  . Smoking status: Current Every Day Smoker -- 0.33  packs/day for 14 years    Types: Cigarettes  . Smokeless tobacco: Never Used  . Alcohol Use: Yes     Comment: OCCASIONAL   . Drug Use: No  . Sexual Activity: Not Currently   Other Topics Concern  . Not on file   Social History Narrative   [place] in an apartment by himself. No pets. Fun: [employer] and phone [name], travel.   Denies religious beliefs effecting health care.     Review of Systems  Constitutional: Denies fever, chills, fatigue, or significant weight gain/loss. HENT: Head: Denies headache or neck pain Ears: Denies changes in hearing, ringing in ears, earache, drainage Nose: Denies discharge, stuffiness, itching, nosebleed, sinus pain Throat: Denies sore throat, hoarseness, dry mouth, sores, thrush Eyes: Denies loss/changes in vision, pain, redness, blurry/double vision, flashing lights Cardiovascular: Denies chest pain/discomfort, tightness, palpitations, shortness of breath with activity, difficulty lying down, swelling, sudden awakening with shortness of breath Respiratory: Denies shortness of breath, cough, sputum production, wheezing Gastrointestinal: Denies dysphasia, heartburn, change in appetite, nausea, change in bowel habits, rectal bleeding, constipation, diarrhea, yellow skin or eyes Genitourinary: Denies frequency, urgency, burning/pain, blood in urine, incontinence, change in urinary  strength. Musculoskeletal: Denies muscle/joint pain, stiffness, back pain, redness or swelling of joints, trauma Skin: Denies rashes, lumps, itching, dryness, color changes, or hair/nail changes Neurological: Denies dizziness, fainting, seizures, weakness, numbness, tingling, tremor Psychiatric - Denies nervousness, stress, depression or memory loss Endocrine: Denies heat or cold intolerance, sweating, frequent urination, excessive thirst, changes in appetite Hematologic: Denies ease of bruising or bleeding     Objective:    BP 144/84  mmHg  Pulse 74  Temp(Src) 97.9 F (36.6 C) (Oral)  Resp 18  Ht  (1.778 m)  Wt 398 lb (180.532 kg)  BMI 57.11 kg/m2  SpO2 97% Nursing note and vital signs reviewed.  Physical Exam  Constitutional: He is oriented to person, place, and time. He appears well-developed and well-nourished.  HENT:  Head: Normocephalic.  Right Ear: Hearing, tympanic membrane, external ear and ear canal normal.  Left Ear: Hearing, tympanic membrane, external ear and ear canal normal.  Nose: Nose normal.  Mouth/Throat: Uvula is midline, oropharynx is clear and moist and mucous membranes are normal.  Eyes: Conjunctivae and EOM are normal. Pupils are equal, round, and reactive to light.  Neck: Neck supple. No JVD present. No tracheal deviation present. No thyromegaly present.  Cardiovascular: Normal rate, regular rhythm, normal heart sounds and intact distal pulses.   Pulmonary/Chest: Effort normal and breath sounds normal.  Abdominal: Soft. Bowel sounds are normal. He exhibits no distension and no mass. There is no tenderness. There is no rebound and no guarding.  Musculoskeletal: Normal range of motion. He exhibits no edema or tenderness.  Lymphadenopathy:    He has no cervical adenopathy.  Neurological: He is alert and oriented to person, place, and time. He has normal reflexes. No cranial nerve deficit. He exhibits normal muscle tone. Coordination normal.  Skin: Skin is warm and dry.  Psychiatric: He has a normal mood and affect. His behavior is normal. Judgment and thought content normal.       Assessment & Plan:

## 2014-11-26 NOTE — Patient Instructions (Addendum)
Thank you for choosing  HealthCare.  Summary/Instructions:  Health Maintenance A healthy lifestyle and preventative care can promote health and wellness.  Maintain regular health, dental, and eye exams.  Eat a healthy diet. Foods like vegetables, fruits, whole grains, low-fat dairy products, and lean protein foods contain the nutrients you need and are low in calories. Decrease your intake of foods high in solid fats, added sugars, and salt. Get information about a proper diet from your health care provider, if necessary.  Regular physical exercise is one of the most important things you can do for your health. Most adults should get at least 150 minutes of moderate-intensity exercise (any activity that increases your heart rate and causes you to sweat) each week. In addition, most adults need muscle-strengthening exercises on 2 or more days a week.   Maintain a healthy weight. The body mass index (BMI) is a screening tool to identify possible weight problems. It provides an estimate of body fat based on height and weight. Your health care provider can find your BMI and can help you achieve or maintain a healthy weight. For males 20 years and older:  A BMI below 18.5 is considered underweight.  A BMI of 18.5 to 24.9 is normal.  A BMI of 25 to 29.9 is considered overweight.  A BMI of 30 and above is considered obese.  Maintain normal blood lipids and cholesterol by exercising and minimizing your intake of saturated fat. Eat a balanced diet with plenty of fruits and vegetables. Blood tests for lipids and cholesterol should begin at age 20 and be repeated every 5 years. If your lipid or cholesterol levels are high, you are over age 50, or you are at high risk for heart disease, you may need your cholesterol levels checked more frequently.Ongoing high lipid and cholesterol levels should be treated with medicines if diet and exercise are not working.  If you smoke, find out from your  health care provider how to quit. If you do not use tobacco, do not start.  Lung cancer screening is recommended for adults aged 55-80 years who are at high risk for developing lung cancer because of a history of smoking. A yearly low-dose CT scan of the lungs is recommended for people who have at least a 30-pack-year history of smoking and are current smokers or have quit within the past 15 years. A pack year of smoking is smoking an average of 1 pack of cigarettes a day for 1 year (for example, a 30-pack-year history of smoking could mean smoking 1 pack a day for 30 years or 2 packs a day for 15 years). Yearly screening should continue until the smoker has stopped smoking for at least 15 years. Yearly screening should be stopped for people who develop a health problem that would prevent them from having lung cancer treatment.  If you choose to drink alcohol, do not have more than 2 drinks per day. One drink is considered to be 12 oz (360 mL) of beer, 5 oz (150 mL) of wine, or 1.5 oz (45 mL) of liquor.  Avoid the use of street drugs. Do not share needles with anyone. Ask for help if you need support or instructions about stopping the use of drugs.  High blood pressure causes heart disease and increases the risk of stroke. Blood pressure should be checked at least every 1-2 years. Ongoing high blood pressure should be treated with medicines if weight loss and exercise are not effective.  If you are   45-79 years old, ask your health care provider if you should take aspirin to prevent heart disease.  Diabetes screening involves taking a blood sample to check your fasting blood sugar level. This should be done once every 3 years after age 45 if you are at a normal weight and without risk factors for diabetes. Testing should be considered at a younger age or be carried out more frequently if you are overweight and have at least 1 risk factor for diabetes.  Colorectal cancer can be detected and often  prevented. Most routine colorectal cancer screening begins at the age of 50 and continues through age 75. However, your health care provider may recommend screening at an earlier age if you have risk factors for colon cancer. On a yearly basis, your health care provider may provide home test kits to check for hidden blood in the stool. A small camera at the end of a tube may be used to directly examine the colon (sigmoidoscopy or colonoscopy) to detect the earliest forms of colorectal cancer. Talk to your health care provider about this at age 50 when routine screening begins. A direct exam of the colon should be repeated every 5-10 years through age 75, unless early forms of precancerous polyps or small growths are found.  People who are at an increased risk for hepatitis B should be screened for this virus. You are considered at high risk for hepatitis B if:  You were born in a country where hepatitis B occurs often. Talk with your health care provider about which countries are considered high risk.  Your parents were born in a high-risk country and you have not received a shot to protect against hepatitis B (hepatitis B vaccine).  You have HIV or AIDS.  You use needles to inject street drugs.  You live with, or have sex with, someone who has hepatitis B.  You are a man who has sex with other men (MSM).  You get hemodialysis treatment.  You take certain medicines for conditions like cancer, organ transplantation, and autoimmune conditions.  Hepatitis C blood testing is recommended for all people born from 1945 through 1965 and any individual with known risk factors for hepatitis C.  Healthy men should no longer receive prostate-specific antigen (PSA) blood tests as part of routine cancer screening. Talk to your health care provider about prostate cancer screening.  Testicular cancer screening is not recommended for adolescents or adult males who have no symptoms. Screening includes  self-exam, a health care provider exam, and other screening tests. Consult with your health care provider about any symptoms you have or any concerns you have about testicular cancer.  Practice safe sex. Use condoms and avoid high-risk sexual practices to reduce the spread of sexually transmitted infections (STIs).  You should be screened for STIs, including gonorrhea and chlamydia if:  You are sexually active and are younger than 24 years.  You are older than 24 years, and your health care provider tells you that you are at risk for this type of infection.  Your sexual activity has changed since you were last screened, and you are at an increased risk for chlamydia or gonorrhea. Ask your health care provider if you are at risk.  If you are at risk of being infected with HIV, it is recommended that you take a prescription medicine daily to prevent HIV infection. This is called pre-exposure prophylaxis (PrEP). You are considered at risk if:  You are a man who has sex with other   men (MSM).  You are a heterosexual man who is sexually active with multiple partners.  You take drugs by injection.  You are sexually active with a partner who has HIV.  Talk with your health care provider about whether you are at high risk of being infected with HIV. If you choose to begin PrEP, you should first be tested for HIV. You should then be tested every 3 months for as long as you are taking PrEP.  Use sunscreen. Apply sunscreen liberally and repeatedly throughout the day. You should seek shade when your shadow is shorter than you. Protect yourself by wearing long sleeves, pants, a wide-brimmed hat, and sunglasses year round whenever you are outdoors.  Tell your health care provider of new moles or changes in moles, especially if there is a change in shape or color. Also, tell your health care provider if a mole is larger than the size of a pencil eraser.  A one-time screening for abdominal aortic aneurysm  (AAA) and surgical repair of large AAAs by ultrasound is recommended for men aged 65-75 years who are current or former smokers.  Stay current with your vaccines (immunizations). Document Released: 03/29/2008 Document Revised: 10/06/2013 Document Reviewed: 02/26/2011 [employer] Patient Information 2015 [employer]. This information is not intended to replace advice given to you by your health care provider. Make sure you discuss any questions you have with your health care provider.    

## 2014-11-26 NOTE — Assessment & Plan Note (Addendum)
1) Anticipatory Guidance: Discussed importance of wearing a seatbelt while driving and not texting while driving; changing batteries in smoke detector at least once annually; wearing suntan lotion when outside; eating a balanced and moderate diet; getting physical activity at least 30 minutes per day.  2) Immunizations / Screenings / Labs:  Patient refused influenza vaccination. All other immunizations are up to date per recommendations. All screenings are up to date per recommendations. Obtain CBC, BMET, A1c, Lipid profile and TSH.   Overall well exam. Discussed risk factor of extreme morbid obesity. Patient encouraged to eat a nutrient dense diet and increase his physical activity. Goal would be to lose approximately 5-10% of his body. Continue to work towards healthy lifestyle/behavior changes. Sleep study is in the process; follow up prevention exam in 1 year.

## 2014-12-06 ENCOUNTER — Encounter: Payer: Self-pay | Admitting: Family

## 2014-12-13 ENCOUNTER — Telehealth: Payer: Self-pay

## 2014-12-13 MED ORDER — BUTALBITAL-APAP-CAFFEINE 50-325-40 MG PO TABS: 1.0000 | ORAL_TABLET | Freq: Four times a day (QID) | ORAL | Status: DC | PRN

## 2014-12-13 NOTE — Telephone Encounter (Signed)
Faxed over fioricet to [facility] on [address]

## 2015-05-09 ENCOUNTER — Emergency Department (HOSPITAL_COMMUNITY)
Admission: EM | Admit: 2015-05-09 | Discharge: 2015-05-09 | Disposition: A | Discharge status: Home / Self Care | Payer: BLUE CROSS/BLUE SHIELD | Attending: Physician Assistant | Admitting: Physician Assistant

## 2015-05-09 ENCOUNTER — Encounter (HOSPITAL_COMMUNITY): Payer: Self-pay | Admitting: *Deleted

## 2015-05-09 ENCOUNTER — Telehealth: Payer: Self-pay | Admitting: Family

## 2015-05-09 DIAGNOSIS — J45909 Unspecified asthma, uncomplicated: Secondary | ICD-10-CM | POA: Diagnosis not present

## 2015-05-09 DIAGNOSIS — Z8619 Personal history of other infectious and parasitic diseases: Secondary | ICD-10-CM | POA: Diagnosis not present

## 2015-05-09 DIAGNOSIS — R0683 Snoring: Secondary | ICD-10-CM

## 2015-05-09 DIAGNOSIS — G43809 Other migraine, not intractable, without status migrainosus: Secondary | ICD-10-CM | POA: Insufficient documentation

## 2015-05-09 DIAGNOSIS — R51 Headache: Secondary | ICD-10-CM | POA: Diagnosis present

## 2015-05-09 DIAGNOSIS — R5383 Other fatigue: Secondary | ICD-10-CM | POA: Diagnosis not present

## 2015-05-09 DIAGNOSIS — Z862 Personal history of diseases of the blood and blood-forming organs and certain disorders involving the immune mechanism: Secondary | ICD-10-CM | POA: Diagnosis not present

## 2015-05-09 DIAGNOSIS — R0989 Other specified symptoms and signs involving the circulatory and respiratory systems: Secondary | ICD-10-CM | POA: Diagnosis not present

## 2015-05-09 DIAGNOSIS — Z720 Tobacco use: Secondary | ICD-10-CM | POA: Diagnosis not present

## 2015-05-09 MED ORDER — DIPHENHYDRAMINE HCL 50 MG/ML IJ SOLN
25.0000 mg | Freq: Once | INTRAMUSCULAR | Status: AC
  Administered 2015-05-09: 25 mg via INTRAVENOUS
  Filled 2015-05-09: qty 1

## 2015-05-09 MED ORDER — PROCHLORPERAZINE EDISYLATE 5 MG/ML IJ SOLN
10.0000 mg | Freq: Once | INTRAMUSCULAR | Status: AC
  Administered 2015-05-09: 10 mg via INTRAVENOUS
  Filled 2015-05-09: qty 2

## 2015-05-09 MED ORDER — SODIUM CHLORIDE 0.9 % IV BOLUS (SEPSIS)
1000.0000 mL | Freq: Once | INTRAVENOUS | Status: AC
  Administered 2015-05-09: 1000 mL via INTRAVENOUS

## 2015-05-09 NOTE — Telephone Encounter (Signed)
The sleep study was ordered and am not sure why it was not completed. I will send a referral to pulmonolgy.

## 2015-05-09 NOTE — Discharge Instructions (Signed)
Please return to your primary care provider and go get a sleep study! We think it will help your daily headaches!  Migraine Headache A migraine headache is an intense, throbbing pain on one or both sides of your head. A migraine can last for 30 minutes to several hours. CAUSES  The exact cause of a migraine headache is not always known. However, a migraine may be caused when nerves in the brain become irritated and release chemicals that cause inflammation. This causes pain. Certain things may also trigger migraines, such as:  Alcohol.  Smoking.  Stress.  Menstruation.  Aged cheeses.  Foods or drinks that contain nitrates, glutamate, aspartame, or tyramine.  Lack of sleep.  Chocolate.  Caffeine.  Hunger.  Physical exertion.  Fatigue.  Medicines used to treat chest pain (nitroglycerine), birth control pills, estrogen, and some blood pressure medicines. SIGNS AND SYMPTOMS  Pain on one or both sides of your head.  Pulsating or throbbing pain.  Severe pain that prevents daily activities.  Pain that is aggravated by any physical activity.  Nausea, vomiting, or both.  Dizziness.  Pain with exposure to bright lights, loud noises, or activity.  General sensitivity to bright lights, loud noises, or smells. Before you get a migraine, you may get warning signs that a migraine is coming (aura). An aura may include:  Seeing flashing lights.  Seeing bright spots, halos, or zigzag lines.  Having tunnel vision or blurred vision.  Having feelings of numbness or tingling.  Having trouble talking.  Having muscle weakness. DIAGNOSIS  A migraine headache is often diagnosed based on:  Symptoms.  Physical exam.  A CT scan or MRI of your head. These imaging tests cannot diagnose migraines, but they can help rule out other causes of headaches. TREATMENT Medicines may be given for pain and nausea. Medicines can also be given to help prevent recurrent migraines.  HOME  CARE INSTRUCTIONS  Only take over-the-counter or prescription medicines for pain or discomfort as directed by your health care provider. The use of long-term narcotics is not recommended.  Lie down in a dark, quiet room when you have a migraine.  Keep a journal to find out what may trigger your migraine headaches. For example, write down:  What you eat and drink.  How much sleep you get.  Any change to your diet or medicines.  Limit alcohol consumption.  Quit smoking if you smoke.  Get 7-9 hours of sleep, or as recommended by your health care provider.  Limit stress.  Keep lights dim if bright lights bother you and make your migraines worse. SEEK IMMEDIATE MEDICAL CARE IF:   Your migraine becomes severe.  You have a fever.  You have a stiff neck.  You have vision loss.  You have muscular weakness or loss of muscle control.  You start losing your balance or have trouble walking.  You feel faint or pass out.  You have severe symptoms that are different from your first symptoms. MAKE SURE YOU:   Understand these instructions.  Will watch your condition.  Will get help right away if you are not doing well or get worse. Document Released: 10/01/2005 Document Revised: 02/15/2014 Document Reviewed: 06/08/2013 [facility] Patient Information 2015 [place], [place]. This information is not intended to replace advice given to you by your health care provider. Make sure you discuss any questions you have with your health care provider. Please return to your primary care provider and go get a sleep study.

## 2015-05-09 NOTE — Telephone Encounter (Signed)
Please check with Tammy Sours about his sleep apnea. Patient stated he didn't throw up blood. It was more like flem from waking up choking and trying to breath.  Please call Cecelia White at [phone].

## 2015-05-09 NOTE — ED Provider Notes (Addendum)
CSN: [id number]     Arrival date & time 05/09/15  2027 History   First MD Initiated Contact with Patient 05/09/15 2103     Chief Complaint  Patient presents with  . Headache  . Cough    Patient is a very pleasant morbidly obese 29 year old male. He is presenting with symptoms of choking and shortness of breath at night. He also reports daily headaches. Patient has had headaches and migraines since he was 73 years old. His lady friend was worried about him so brought him here to the emergency department. Patient has been asked to go to have a sleep study by primary care.    (Consider location/radiation/quality/duration/timing/severity/associated sxs/prior Treatment) Patient is a 29 y.o. male presenting with headaches and cough.  Headache Associated symptoms: cough and fatigue   Associated symptoms: no abdominal pain, no fever, no hearing loss and no seizures   Cough Associated symptoms: headaches   Associated symptoms: no chest pain, no eye discharge, no fever and no shortness of breath     Past Medical History  Diagnosis Date  . Obesity   . Asthma   . Headache(784.0)   . Bleeding disorder     pt reports hospitalized in 2013 for bleeding disorder  . Chicken pox   . Generalized headaches   . Genital warts    Past Surgical History  Procedure Laterality Date  . Knee surgery    . Arthroscopic repair acl     Family History  Problem Relation Age of Onset  . Hypertension Mother   . Arthritis Mother   . Heart disease Mother   . Drug abuse Mother     cocaine  . Diabetes Father   . Heart disease Father   . Drug abuse Father     cocaine  . Heart disease Maternal Grandmother   . Diabetes Paternal Grandmother   . Diabetes Paternal Grandfather    History  Substance Use Topics  . Smoking status: Current Every Day Smoker -- 0.33 packs/day for 14 years    Types: Cigarettes  . Smokeless tobacco: Never Used  . Alcohol Use: Yes     Comment: OCCASIONAL     Review of Systems   Constitutional: Positive for fatigue. Negative for fever and activity change.  HENT: Negative for drooling and hearing loss.   Eyes: Negative for discharge and redness.  Respiratory: Positive for cough. Negative for shortness of breath.   Cardiovascular: Negative for chest pain.  Gastrointestinal: Negative for abdominal pain.  Genitourinary: Negative for dysuria and urgency.  Musculoskeletal: Negative for arthralgias.  Allergic/Immunologic: Negative for immunocompromised state.  Neurological: Positive for headaches. Negative for seizures and speech difficulty.  Psychiatric/Behavioral: Negative for behavioral problems and agitation.  All other systems reviewed and are negative.     Allergies  Review of patient's allergies indicates no known allergies.  Home Medications   Prior to Admission medications   Medication Sig Start Date End Date Taking? Authorizing Provider  Aspirin-Acetaminophen-Caffeine (EXCEDRIN PO) Take 3 tablets by mouth 2 (two) times daily as needed (headache).   Yes Historical Provider, MD  diphenhydramine-acetaminophen (TYLENOL PM) 25-500 MG TABS Take 1-3 tablets by mouth at bedtime as needed.   Yes Historical Provider, MD   BP 138/74 mmHg  Pulse 83  Temp(Src) 98.1 F (36.7 C) (Oral)  Resp 18  SpO2 97% Physical Exam  Constitutional: He is oriented to person, place, and time. He appears well-nourished.  Morbidly obese  HENT:  Head: Normocephalic.  Mouth/Throat: Oropharynx is clear and moist.  Eyes: Conjunctivae are normal.  Neck: No tracheal deviation present.  Cardiovascular: Normal rate.   Pulmonary/Chest: Effort normal. No stridor. No respiratory distress.  Abdominal: Soft. There is no tenderness. There is no guarding.  Musculoskeletal: Normal range of motion. He exhibits no edema.  Neurological: He is oriented to person, place, and time. No cranial nerve deficit.  Neurologically intact  Skin: Skin is warm and dry. No rash noted. He is not  diaphoretic.  Psychiatric: He has a normal mood and affect. His behavior is normal.  Nursing note and vitals reviewed.   ED Course  Procedures (including critical care time) Labs Review Labs Reviewed - No data to display  Imaging Review No results found.   EKG Interpretation None      MDM   Final diagnoses:  None   patient is a very pleasant morbidly obese 29 year old male presenting with daily headaches and short of breath at night. I feel that most of his symptoms may be due to OSA. Lengthy discussion held between me patient and patient's lady friend about following up and getting a sleep study.  They agree and are looking forward to pursue this.  In the meantime I'll treat his typical migraine that he presents with today. Patient has had CAT scans in the past for this and feels that this is no different than his normal migraines.. Cranial nerves are normal. Breath sounds normal.    Raelee Rossmann Randall An, MD 05/09/15 2249  Patient feels improved and wishes to return home.  Zacchary Pompei Randall An, MD 05/09/15 [phone]

## 2015-05-09 NOTE — ED Notes (Addendum)
Pt reports h/a and cough since last night.  Reports he was woken up d/t severe SOB.  States after he was woken up, he realized that he bit the inside of his cheek sometime while asleep.  Pt's family reports he was prescribed Fioricet for his h/a but never filled it.

## 2015-05-10 NOTE — Telephone Encounter (Signed)
Spoke with pts girlfriend and pt had an episode the other night of choking and having SOB. He was coughing up what her daughter described as looking like blood. Pt stated that he felt like he was going to die. His girlfriend is concerned about pt and would like to see about getting a sleep apnea study as soon as possible. She is also requesting a note for work with more descriptive detail on why pt is out. She feels like the hospital note doesn't explain enough. They are leaving for vacation august 12th and she would like so see if there is anything else that is needing to be done and if so if it can be done before then. Please advise.

## 2015-05-10 NOTE — Telephone Encounter (Signed)
Most of his symptoms are related to potential sleep apnea. The next step is to obtain the sleep consult. As previously noted I am not sure why the sleep study was not completed previously, however one thing that he can work on is aggressive weight loss with nutrition and exercise.

## 2015-05-10 NOTE — Telephone Encounter (Signed)
Called girlfriend back. They said they understood. She wants to know if you can send a work note through my chart.

## 2015-05-11 NOTE — Telephone Encounter (Signed)
I am not sure what kind of note patient is wanting and for what days?

## 2015-05-11 NOTE — Telephone Encounter (Signed)
He does not need a note anymore. He went back to work today and explained his situation. I told her to call back if they needed anything. Pt understood.

## 2015-05-16 ENCOUNTER — Encounter: Payer: Self-pay | Admitting: Family

## 2015-05-26 ENCOUNTER — Ambulatory Visit (INDEPENDENT_AMBULATORY_CARE_PROVIDER_SITE_OTHER): Payer: BLUE CROSS/BLUE SHIELD | Admitting: Family

## 2015-05-26 ENCOUNTER — Encounter: Payer: Self-pay | Admitting: Family

## 2015-05-26 VITALS — BP 144/104 | HR 84 | Temp 98.0°F | Resp 18 | Ht 70.0 in | Wt >= 6400 oz

## 2015-05-26 DIAGNOSIS — G43809 Other migraine, not intractable, without status migrainosus: Secondary | ICD-10-CM | POA: Diagnosis not present

## 2015-05-26 DIAGNOSIS — I10 Essential (primary) hypertension: Secondary | ICD-10-CM | POA: Diagnosis not present

## 2015-05-26 MED ORDER — PROPRANOLOL HCL ER 80 MG PO CP24: 80.0000 mg | ORAL_CAPSULE | Freq: Every day | ORAL | Status: AC

## 2015-05-26 NOTE — Progress Notes (Signed)
Pre visit review using our clinic review tool, if applicable. No additional management support is needed unless otherwise documented below in the visit note. 

## 2015-05-26 NOTE — Assessment & Plan Note (Signed)
Blood pressure greater than goal of 140/90 on multiple occasions. Start propranolol. Monitor blood pressure at home. Follow up in 1 month or sooner if needed.

## 2015-05-26 NOTE — Patient Instructions (Addendum)
Thank you for choosing [facility].  Summary/Instructions:  Start taking the propranolol for your blood pressure and headache.   Your prescription(s) have been submitted to your pharmacy or been printed and provided for you. Please take as directed and contact our office if you believe you are having problem(s) with the medication(s) or have any questions.  If your symptoms worsen or fail to improve, please contact our office for further instruction, or in case of emergency go directly to the emergency room at the closest medical facility.   Migraine Headache A migraine headache is an intense, throbbing pain on one or both sides of your head. A migraine can last for 30 minutes to several hours. CAUSES  The exact cause of a migraine headache is not always known. However, a migraine may be caused when nerves in the brain become irritated and release chemicals that cause inflammation. This causes pain. Certain things may also trigger migraines, such as:  Alcohol.  Smoking.  Stress.  Menstruation.  Aged cheeses.  Foods or drinks that contain nitrates, glutamate, aspartame, or tyramine.  Lack of sleep.  Chocolate.  Caffeine.  Hunger.  Physical exertion.  Fatigue.  Medicines used to treat chest pain (nitroglycerine), birth control pills, estrogen, and some blood pressure medicines. SIGNS AND SYMPTOMS  Pain on one or both sides of your head.  Pulsating or throbbing pain.  Severe pain that prevents daily activities.  Pain that is aggravated by any physical activity.  Nausea, vomiting, or both.  Dizziness.  Pain with exposure to bright lights, loud noises, or activity.  General sensitivity to bright lights, loud noises, or smells. Before you get a migraine, you may get warning signs that a migraine is coming (aura). An aura may include:  Seeing flashing lights.  Seeing bright spots, halos, or zigzag lines.  Having tunnel vision or blurred  vision.  Having feelings of numbness or tingling.  Having trouble talking.  Having muscle weakness. DIAGNOSIS  A migraine headache is often diagnosed based on:  Symptoms.  Physical exam.  A CT scan or MRI of your head. These imaging tests cannot diagnose migraines, but they can help rule out other causes of headaches. TREATMENT Medicines may be given for pain and nausea. Medicines can also be given to help prevent recurrent migraines.  HOME CARE INSTRUCTIONS  Only take over-the-counter or prescription medicines for pain or discomfort as directed by your health care provider. The use of long-term narcotics is not recommended.  Lie down in a dark, quiet room when you have a migraine.  Keep a journal to find out what may trigger your migraine headaches. For example, write down:  What you eat and drink.  How much sleep you get.  Any change to your diet or medicines.  Limit alcohol consumption.  Quit smoking if you smoke.  Get 7-9 hours of sleep, or as recommended by your health care provider.  Limit stress.  Keep lights dim if bright lights bother you and make your migraines worse. SEEK IMMEDIATE MEDICAL CARE IF:   Your migraine becomes severe.  You have a fever.  You have a stiff neck.  You have vision loss.  You have muscular weakness or loss of muscle control.  You start losing your balance or have trouble walking.  You feel faint or pass out.  You have severe symptoms that are different from your first symptoms. MAKE SURE YOU:   Understand these instructions.  Will watch your condition.  Will get help right away if  you are not doing well or get worse. Document Released: 10/01/2005 Document Revised: 02/15/2014 Document Reviewed: 06/08/2013 [facility] Patient Information 2015 [place], [place]. This information is not intended to replace advice given to you by your health care provider. Make sure you discuss any questions you have with your health care  provider.

## 2015-05-26 NOTE — Assessment & Plan Note (Signed)
Migraine headaches with failed attempts with fioricet and imitrex mainly related to reactions to medications. Start propranolol for migraine prevention. Refer to neurology for assistance in management. Follow up if symptoms worsen or reactions to medications.

## 2015-05-26 NOTE — Progress Notes (Signed)
Subjective:    Patient ID: [name] [name], male    DOB: [birth year], 29 y.o.   MRN: [id number]  Chief Complaint  Patient presents with  . Headache    still having headaches,     HPI:  [name] [name] is a 29 y.o. male with a PMH of asthma, obesity, headaches, and knee surgery who presents today for a follow up office visit.     1.) Headaches - Continues to experience the associated symptom of headaches. Described as pulsating/throbbing with sensitivity to light and sound with associated nausea but has never vomited. Previously tried on fioricet and imitrex with minimal relief. Was prescribed Midrin, however has not tried it.   2.) Blood pressure - This is a new problem. Has been noted to have increased blood pressure on several occasions and is not currently maintained on medication. Denies associated symptoms of changes in vision or cardiac related symptoms.   BP Readings from Last 3 Encounters:  05/26/15 144/104  05/09/15 120/81  11/26/14 144/84    No Known Allergies   Current Outpatient Prescriptions on File Prior to Visit  Medication Sig Dispense Refill  . Aspirin-Acetaminophen-Caffeine (EXCEDRIN PO) Take 3 tablets by mouth 2 (two) times daily as needed (headache).    . diphenhydramine-acetaminophen (TYLENOL PM) 25-500 MG TABS Take 1-3 tablets by mouth at bedtime as needed.     No current facility-administered medications on file prior to visit.    Review of Systems  Eyes:       Negative for changes in vision.   Respiratory: Negative for choking, chest tightness, shortness of breath and wheezing.   Cardiovascular: Negative for chest pain, palpitations and leg swelling.  Neurological: Positive for headaches. Negative for weakness.      Objective:    BP 144/104 mmHg  Pulse 84  Temp(Src) 98 F (36.7 C) (Oral)  Resp 18  Ht  (1.778 m)  Wt 400 lb (181.439 kg)  BMI 57.39 kg/m2  SpO2 97% Nursing note and vital signs reviewed.  Physical Exam    Constitutional: He is oriented to person, place, and time. He appears well-developed and well-nourished. No distress.  Cardiovascular: Normal rate, regular rhythm, normal heart sounds and intact distal pulses.   Pulmonary/Chest: Effort normal and breath sounds normal.  Neurological: He is alert and oriented to person, place, and time. He has normal reflexes. He displays normal reflexes. No cranial nerve deficit. He exhibits normal muscle tone. Coordination normal.  Skin: Skin is warm and dry.  Psychiatric: He has a normal mood and affect. His behavior is normal. Judgment and thought content normal.       Assessment & Plan:   Problem List Items Addressed This Visit      Cardiovascular and Mediastinum   Migraine    Migraine headaches with failed attempts with fioricet and imitrex mainly related to reactions to medications. Start propranolol for migraine prevention. Refer to neurology for assistance in management. Follow up if symptoms worsen or reactions to medications.       Relevant Medications   propranolol ER (INDERAL LA) 80 MG 24 hr capsule   Other Relevant Orders   Ambulatory referral to Neurology   Essential hypertension - Primary    Blood pressure greater than goal of 140/90 on multiple occasions. Start propranolol. Monitor blood pressure at home. Follow up in 1 month or sooner if needed.       Relevant Medications   propranolol ER (INDERAL LA) 80 MG 24 hr capsule

## 2015-07-11 ENCOUNTER — Encounter: Payer: Self-pay | Admitting: Neurology

## 2015-07-11 ENCOUNTER — Ambulatory Visit (INDEPENDENT_AMBULATORY_CARE_PROVIDER_SITE_OTHER): Payer: BLUE CROSS/BLUE SHIELD | Admitting: Neurology

## 2015-07-11 VITALS — BP 120/88 | HR 84 | Ht 70.0 in | Wt >= 6400 oz

## 2015-07-11 DIAGNOSIS — G43009 Migraine without aura, not intractable, without status migrainosus: Secondary | ICD-10-CM

## 2015-07-11 NOTE — Progress Notes (Signed)
[facility] Neurology Division Clinic Note - Initial Visit   Date: 07/11/2015  [name] [name] MRN: [id number] DOB: 10-25-1985   Dear Jeanine Luz, FNP:  Thank you for your kind referral of [name] [name] for consultation of migraines. Although his history is well known to you, please allow [place] to reiterate it for the purpose of our medical record. The patient was accompanied to the clinic by girlfriend who also provides collateral information.     History of Present Illness: [name] [name] is a 29 y.o. right-handed African American male with obesity, hypertension, prior tobacco use (quit last week) and sleep apnea presenting for evaluation of migraines.  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   Starting around the age of 29, he developed migraines for which he was taking tylenol #3.  Over the years, his headaches slowly started worsening in high school and in his 90s.  Headaches are bifrontal, described throbbing, achy, and pounding.  Pain lasts 6-24 hours, occuring about 2-3 times per week.  He endorses photophobia, phonophobia, and nasuea.  Rest helps his pain and activity exacerbates his pain.  He takes about (3) excedrin 2-3 times per week. He endorses relief with excedrin.  Tylenol does not help.  No identifiable triggers.  No associated vision changes, sensory or motor changes.    He was started on propranolol for headaches as well as hypertension in August 2016, but is not very compliant with it.  He takes it about once per week.    Out-side paper records, electronic medical record, and images have been reviewed where available and summarized as:  Lab Results  Component Value Date   TSH 1.17 11/26/2014   Lab Results  Component Value Date   HGBA1C 5.9 11/26/2014   Lab Results  Component Value Date   CHOL 116 11/26/2014   HDL 25.50* 11/26/2014   LDLCALC 75 11/26/2014   TRIG 80.0 11/26/2014   CHOLHDL 5 11/26/2014     Past Medical History  Diagnosis Date  . Obesity   .  Asthma   . Headache(784.0)   . Bleeding disorder     pt reports hospitalized in 2013 for bleeding disorder  . Chicken pox   . Generalized headaches   . Genital warts     Past Surgical History  Procedure Laterality Date  . Knee surgery    . Arthroscopic repair acl       Medications:  Outpatient Encounter Prescriptions as of 07/11/2015  Medication Sig  . Aspirin-Acetaminophen-Caffeine (EXCEDRIN PO) Take 3 tablets by mouth 2 (two) times daily as needed (headache).  . diphenhydramine-acetaminophen (TYLENOL PM) 25-500 MG TABS Take 1-3 tablets by mouth at bedtime as needed.  . propranolol ER (INDERAL LA) 80 MG 24 hr capsule Take 1 capsule (80 mg total) by mouth daily.   No facility-administered encounter medications on file as of 07/11/2015.     Allergies: No Known Allergies  Family History: Family History  Problem Relation Age of Onset  . Hypertension Mother   . Arthritis Mother   . Heart disease Mother   . Drug abuse Mother     cocaine  . Diabetes Father   . Heart disease Father   . Drug abuse Father     cocaine  . Heart disease Maternal Grandmother   . Diabetes Paternal Grandmother   . Diabetes Paternal Grandfather   . Migraines Daughter   . Healthy Brother     Social History: Social History  Substance Use Topics  . Smoking status: Former Smoker --  1.00 packs/day for 14 years    Types: Cigarettes    Quit date: 07/04/2015  . Smokeless tobacco: Never Used  . Alcohol Use: 0.0 oz/week    0 Standard drinks or equivalent per week     Comment: Rare   Social History   Social History Narrative   Born and raised in [place], [place]. Currently resides in an apartment with a roommate. No pets. Fun: Gam[employer] and phone [name], travel.   Denies religious beliefs effecting health care.     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       Review of Systems:  CONSTITUTIONAL: No fevers, chills, night sweats, or weight loss.   EYES: No visual changes or eye  pain ENT: No hearing changes.  No history of nose bleeds.   RESPIRATORY: No cough, wheezing and shortness of breath.   CARDIOVASCULAR: Negative for chest pain, and palpitations.   GI: Negative for abdominal discomfort, blood in stools or black stools.  No recent change in bowel habits.   GU:  No history of incontinence.   MUSCLOSKELETAL: No history of joint pain or swelling.  No myalgias.   SKIN: Negative for lesions, rash, and itching.   HEMATOLOGY/ONCOLOGY: Negative for prolonged bleeding, bruising easily, and swollen nodes.  No history of cancer.   ENDOCRINE: Negative for cold or heat intolerance, polydipsia or goiter.   PSYCH:  No depression or anxiety symptoms.   NEURO: As Above.   Vital Signs:  BP 120/88 mmHg  Pulse 84  Ht  (1.778 m)  Wt 403 lb (182.8 kg)  BMI 57.82 kg/m2  SpO2 98%  General Medical Exam:   General:  Well appearing, comfortable.   Eyes/ENT: see cranial nerve examination.   Neck: No masses appreciated.  Full range of motion without tenderness.  No carotid bruits. Respiratory:  Clear to auscultation, good air entry bilaterally.   Cardiac:  Regular rate and rhythm, no murmur.   Extremities:  No deformities, edema, or skin discoloration.  Skin:  No rashes or lesions.  Neurological Exam: MENTAL STATUS including orientation to time, place, person, recent and remote memory, attention span and concentration, language, and fund of knowledge is normal.  Speech is not dysarthric.  CRANIAL NERVES: II:  No visual field defects.  Unremarkable fundi.   III-IV-VI: Pupils equal round and reactive to light.  Normal conjugate, extra-ocular eye movements in all directions of gaze.  No nystagmus.  No ptosis.   V:  Normal facial sensation.     VII:  Normal facial symmetry and movements.  No pathologic facial reflexes.  VIII:  Normal hearing and vestibular function.   IX-X:  Normal palatal movement.   XI:  Normal shoulder shrug and head rotation.   XII:  Normal tongue  strength and range of motion, no deviation or fasciculation.  MOTOR:  No atrophy, fasciculations or abnormal movements.  No pronator drift.  Tone is normal.    Right Upper Extremity:    Left Upper Extremity:    Deltoid  5/5   Deltoid  5/5   Biceps  5/5   Biceps  5/5   Triceps  5/5   Triceps  5/5   Wrist extensors  5/5   Wrist extensors  5/5   Wrist flexors  5/5   Wrist flexors  5/5   Finger extensors  5/5   Finger extensors  5/5   Finger flexors  5/5   Finger flexors  5/5   Dorsal interossei  5/5  Dorsal interossei  5/5   Abductor pollicis  5/5   Abductor pollicis  5/5   Tone (Ashworth scale)  0  Tone (Ashworth scale)  0   Right Lower Extremity:    Left Lower Extremity:    Hip flexors  5/5   Hip flexors  5/5   Hip extensors  5/5   Hip extensors  5/5   Knee flexors  5/5   Knee flexors  5/5   Knee extensors  5/5   Knee extensors  5/5   Dorsiflexors  5/5   Dorsiflexors  5/5   Plantarflexors  5/5   Plantarflexors  5/5   Toe extensors  5/5   Toe extensors  5/5   Toe flexors  5/5   Toe flexors  5/5   Tone (Ashworth scale)  0  Tone (Ashworth scale)  0   MSRs:  Right                                                                 Left brachioradialis 2+  brachioradialis 2+  biceps 2+  biceps 2+  triceps 2+  triceps 2+  patellar 2+  patellar 2+  ankle jerk 2+  ankle jerk 2+  Hoffman no  Hoffman no  plantar response down  plantar response down   SENSORY:  Normal and symmetric perception of light touch, pinprick, vibration, and proprioception.  Romberg's sign absent.   COORDINATION/GAIT: Normal finger-to- nose-finger and heel-to-shin.  Intact rapid alternating movements bilaterally.  Able to rise from a chair without using arms.  Gait narrow based and stable. Unsteady Tandem and stressed gait intact.    IMPRESSION: 1.  Migraine without aura, chronic  - Untreated OSA also contributing to morning headaches  - stressed the importance of medication compliance   - he will take  propranolol  daily, this may need to be increased if no improvement. Consider topamax going forward  - Previously tried:  Imitrex, stopped due to side effect of erection  - Normal exam with chronic headaches, with imaging done as a child.  Repeat if headaches worsen, or changes in character and intensity  2.  History of tobacco use, stopped last week  3.  Morbid obesity, no currently on a diet or exercise program  - Healthy lifestyle decisions discussed at length, especially given his young age  - Weight loss strategies discussed   PLAN/RECOMMENDATIONS:  1.  Start taking propranolol  daily as prescribed 2.  OK to take excedrin and benadryl at headache onset, but limit to twice per week 3.  Excellent work with quitting smoking!  Try your best to keep up with it 4.  Start a diet and exercise program   Return to clinic as needed    The duration of this appointment visit was 45 minutes of face-to-face time with the patient.  Greater than 50% of this time was spent in counseling, explanation of diagnosis, planning of further management, and coordination of care.   Thank you for allowing me to participate in patient's care.  If I can answer any additional questions, I would be pleased to do so.    Sincerely,    Donika K. Allena Katz, DO

## 2015-07-11 NOTE — Patient Instructions (Addendum)
1.  Start taking propranolol  daily as prescribed 2.  OK to take excedrin and benadryl at headache onset, but limit to twice per week 3.  Excellent work with quitting smoking!  Try your best to keep up with it 4.  Start a diet and exercise program 5.  Agree with your sleep evaluation as obstructive sleep apnea may be contributing to your morning headaches 6.  If your headaches do not improve, we may need to consider switching you to topamax  Return to clinic as needed

## 2015-07-15 ENCOUNTER — Ambulatory Visit (INDEPENDENT_AMBULATORY_CARE_PROVIDER_SITE_OTHER): Payer: BLUE CROSS/BLUE SHIELD | Admitting: Pulmonary Disease

## 2015-07-15 ENCOUNTER — Encounter: Payer: Self-pay | Admitting: Pulmonary Disease

## 2015-07-15 VITALS — BP 106/68 | HR 86 | Temp 97.8°F | Ht 70.0 in | Wt 399.0 lb

## 2015-07-15 DIAGNOSIS — G4733 Obstructive sleep apnea (adult) (pediatric): Secondary | ICD-10-CM | POA: Diagnosis not present

## 2015-07-15 DIAGNOSIS — Z6841 Body Mass Index (BMI) 40.0 and over, adult: Secondary | ICD-10-CM

## 2015-07-15 NOTE — Progress Notes (Signed)
Chief Complaint  Patient presents with  . SLEEP CONSULT    pt referred by DR. Calone. pt states he had an episode a couple months ago he stopped breathing in his sleep, pt woke up coughing and choking. pt states he wakes up with headaches. pt wakes up restless at times. pt is having snoring, Epworth score:     History of Present Illness: [name] [name] is a 29 y.o. male for evaluation of sleep problems.  He has noticed trouble with his sleep for some time, and this has been getting worse.  He has loud snoring.  He can't sleep on his back >> if he does, then he wakes up feeling like he is choking and can't breath.  He has been told by family that he stops breathing while asleep.  He will get frequent dreams at night.  He goes to sleep at 12 am.  He falls asleep quickly.  He usually sleeps through the night.  He gets out of bed at 6 am.  He feels tired in the morning.  He gets frequent morning headaches, and was seen recently by neurology.  He does not use anything to help him fall sleep.  He drinks soda to stay awake.  He can fall asleep easily while watching TV or working on computer.  He denies any difficulty with driving.Marland Kitchen  He denies sleep walking, sleep talking, bruxism, or nightmares.  There is no history of restless legs.  He denies sleep hallucinations, sleep paralysis, or cataplexy.  The Epworth score is 18 out of 24.   [name] [name]  has a past medical history of Obesity; Asthma; Headache(784.0); Bleeding disorder; Chicken pox; Generalized headaches; and Genital warts.  [name] [name]  has past surgical history that includes Knee surgery and Arthroscopic repair ACL.  Prior to Admission medications   Medication Sig Start Date End Date Taking? Authorizing Provider  Aspirin-Acetaminophen-Caffeine (EXCEDRIN PO) Take 3 tablets by mouth 2 (two) times daily as needed (headache).   Yes Historical Provider, MD  diphenhydrAMINE (BENADRYL) 25 MG tablet Take 25 mg by mouth every 6 (six)  hours as needed.   Yes Historical Provider, MD  diphenhydramine-acetaminophen (TYLENOL PM) 25-500 MG TABS Take 1-3 tablets by mouth at bedtime as needed.   Yes Historical Provider, MD  propranolol ER (INDERAL LA) 80 MG 24 hr capsule Take 1 capsule (80 mg total) by mouth daily. 05/26/15  Yes Veryl Speak, FNP    No Known Allergies  His family history includes Arthritis in his mother; Diabetes in his father, paternal grandfather, and paternal grandmother; Drug abuse in his father and mother; Healthy in his brother; Heart disease in his father, maternal grandmother, and mother; Hypertension in his mother; Migraines in his daughter.  He  reports that he quit smoking 11 days ago. His smoking use included Cigarettes. He has a 14 pack-year smoking history. He has never used smokeless tobacco. He reports that he drinks alcohol. He reports that he does not use illicit drugs.  Review of Systems  Constitutional: Negative for fever and unexpected weight change.  HENT: Negative for congestion, dental problem, ear pain, nosebleeds, postnasal drip, rhinorrhea, sinus pressure, sneezing, sore throat and trouble swallowing.   Eyes: Positive for itching. Negative for redness.  Respiratory: Positive for chest tightness and shortness of breath. Negative for cough and wheezing.   Cardiovascular: Negative for palpitations and leg swelling.  Gastrointestinal: Negative for nausea and vomiting.  Genitourinary: Negative for dysuria.  Musculoskeletal: Negative for joint swelling.  Skin: Negative  for rash.  Neurological: Positive for headaches.  Hematological: Does not bruise/bleed easily.  Psychiatric/Behavioral: Negative for dysphoric mood. The patient is not nervous/anxious.    Physical Exam: BP 106/68 mmHg  Pulse 86  Temp(Src) 97.8 F (36.6 C) (Oral)  Ht  (1.778 m)  Wt 399 lb (180.985 kg)  BMI 57.25 kg/m2  SpO2 99%  General - No distress ENT - No sinus tenderness, no oral exudate, no LAN, no  thyromegaly, TM clear, pupils equal/reactive, MP 3, 2+ tonsils Cardiac - s1s2 regular, no murmur, pulses symmetric Chest - No wheeze/rales/dullness, good air entry, normal respiratory excursion Back - No focal tenderness Abd - Soft, non-tender, no organomegaly, + bowel sounds Ext - No edema Neuro - Normal strength, cranial nerves intact Skin - No rashes Psych - Normal mood, and behavior  Discussion: He has snoring, sleep disruption, witnessed apnea, and daytime sleepiness.  His BMI is > 35.  I am concerned that he could have obstructive sleep apnea.  We discussed how sleep apnea can affect various health problems including risks for hypertension, cardiovascular disease, and diabetes.  We also discussed how sleep disruption can increase risks for accident, such as while driving.  Weight loss as a means of improving sleep apnea was also reviewed.  Additional treatment options discussed were CPAP therapy, oral appliance, and surgical intervention.  Assessment/plan:  Obstructive sleep apnea. Plan: - will arrange for home sleep study pending insurance approval  Morbid obesity. Plan: - discussed importance of weight loss   Coralyn Helling, M.D. Pager [phone]

## 2015-07-15 NOTE — Progress Notes (Deleted)
   Subjective:    Patient ID: [name] [name], male    DOB: 12-09-1985, 29 y.o.   MRN: [id number]  HPI    Review of Systems  Constitutional: Negative for fever and unexpected weight change.  HENT: Negative for congestion, dental problem, ear pain, nosebleeds, postnasal drip, rhinorrhea, sinus pressure, sneezing, sore throat and trouble swallowing.   Eyes: Positive for itching. Negative for redness.  Respiratory: Positive for chest tightness and shortness of breath. Negative for cough and wheezing.   Cardiovascular: Negative for palpitations and leg swelling.  Gastrointestinal: Negative for nausea and vomiting.  Genitourinary: Negative for dysuria.  Musculoskeletal: Negative for joint swelling.  Skin: Negative for rash.  Neurological: Positive for headaches.  Hematological: Does not bruise/bleed easily.  Psychiatric/Behavioral: Negative for dysphoric mood. The patient is not nervous/anxious.        Objective:   Physical Exam        Assessment & Plan:

## 2015-07-15 NOTE — Patient Instructions (Signed)
Will arrange for home sleep study Will call to arrange for follow up after sleep study reviewed  

## 2015-07-25 ENCOUNTER — Telehealth: Payer: Self-pay | Admitting: Pulmonary Disease

## 2015-07-25 NOTE — Telephone Encounter (Signed)
Called [name] [name] back and explained that I was sorry but I already had 3 other people picking up my machines for today and he would just have to wait and pick up HST tomorrow as per our last conversation. He stated he knew he was calling in late at the last minute.

## 2015-07-27 DIAGNOSIS — G4733 Obstructive sleep apnea (adult) (pediatric): Secondary | ICD-10-CM | POA: Diagnosis not present

## 2015-07-28 ENCOUNTER — Telehealth: Payer: Self-pay | Admitting: Pulmonary Disease

## 2015-07-28 ENCOUNTER — Encounter: Payer: Self-pay | Admitting: Pulmonary Disease

## 2015-07-28 ENCOUNTER — Encounter: Payer: Self-pay | Admitting: *Deleted

## 2015-07-28 DIAGNOSIS — G4733 Obstructive sleep apnea (adult) (pediatric): Secondary | ICD-10-CM

## 2015-07-28 NOTE — Telephone Encounter (Signed)
Work note done. Placed for pick up. Pt is aware. Nothing further needed

## 2015-07-28 NOTE — Telephone Encounter (Signed)
Called spoke with pt. He reports he did the home sleep test last night. He reports he is feeling very restless and was not able to get enough sleep. 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 He wants a work note for today and he can return tomorrow. Please advise Dr. Craige CottaSood thanks

## 2015-07-28 NOTE — Telephone Encounter (Signed)
Okay to give him work excuse for today.

## 2015-07-28 NOTE — Telephone Encounter (Signed)
HST 07/27/15 >> AHI 22.1, SaO2 low 71%  Will have my nurse inform pt that sleep study shows moderate sleep apnea.  Options are 1) CPAP now, 2) ROV first.  If He is agreeable to CPAP, then please send order for auto CPAP range 5 to 15 cm H2O with heated humidity and mask of choice.  Have download sent 1 month after starting CPAP and set up ROV 2 months after starting CPAP.  ROV can be with me or Tammy Parrett.

## 2015-07-29 NOTE — Telephone Encounter (Signed)
I spoke with patient about results and he verbalized understanding and had no questions Order placed for CPAP appt scheduled to see TP 10/21/15. Nothing further needed

## 2015-08-04 DIAGNOSIS — G4733 Obstructive sleep apnea (adult) (pediatric): Secondary | ICD-10-CM | POA: Diagnosis not present

## 2015-08-05 ENCOUNTER — Other Ambulatory Visit: Payer: Self-pay | Admitting: *Deleted

## 2015-08-05 DIAGNOSIS — G4733 Obstructive sleep apnea (adult) (pediatric): Secondary | ICD-10-CM

## 2015-09-28 ENCOUNTER — Telehealth: Payer: Self-pay

## 2015-09-28 NOTE — Telephone Encounter (Signed)
Left Voice Mail for pt to call back.   RE: Flu Vaccine for 2016  

## 2015-10-20 ENCOUNTER — Telehealth: Payer: Self-pay | Admitting: Adult Health

## 2015-10-20 NOTE — Telephone Encounter (Signed)
LVM for pt to return call  Need to verify patient received CPAP and on what date. Order was placed on 09/28/15 with [facility]. If patient did not receive CPAP until the 12/14 we need to reschedule ov so that we can receive a full 30 days of data. Unless patient is having issues then we may keep ov as scheduled.

## 2015-10-21 ENCOUNTER — Ambulatory Visit: Payer: Self-pay | Admitting: Adult Health

## 2015-10-26 NOTE — Telephone Encounter (Signed)
Pt no-showed for appt, with no appt rescheduled at this time. lmtcb X1 for pt to reschedule appt.

## 2015-10-27 ENCOUNTER — Emergency Department (HOSPITAL_COMMUNITY): Payer: BLUE CROSS/BLUE SHIELD

## 2015-10-27 ENCOUNTER — Encounter (HOSPITAL_COMMUNITY): Payer: Self-pay | Admitting: Emergency Medicine

## 2015-10-27 ENCOUNTER — Emergency Department (HOSPITAL_COMMUNITY)
Admission: EM | Admit: 2015-10-27 | Discharge: 2015-10-28 | Disposition: A | Discharge status: Home / Self Care | Payer: BLUE CROSS/BLUE SHIELD | Attending: Emergency Medicine | Admitting: Emergency Medicine

## 2015-10-27 DIAGNOSIS — Y9389 Activity, other specified: Secondary | ICD-10-CM | POA: Diagnosis not present

## 2015-10-27 DIAGNOSIS — Z862 Personal history of diseases of the blood and blood-forming organs and certain disorders involving the immune mechanism: Secondary | ICD-10-CM | POA: Insufficient documentation

## 2015-10-27 DIAGNOSIS — Z8669 Personal history of other diseases of the nervous system and sense organs: Secondary | ICD-10-CM | POA: Insufficient documentation

## 2015-10-27 DIAGNOSIS — Z8619 Personal history of other infectious and parasitic diseases: Secondary | ICD-10-CM | POA: Insufficient documentation

## 2015-10-27 DIAGNOSIS — Y9289 Other specified places as the place of occurrence of the external cause: Secondary | ICD-10-CM | POA: Insufficient documentation

## 2015-10-27 DIAGNOSIS — E669 Obesity, unspecified: Secondary | ICD-10-CM | POA: Insufficient documentation

## 2015-10-27 DIAGNOSIS — Y998 Other external cause status: Secondary | ICD-10-CM | POA: Diagnosis not present

## 2015-10-27 DIAGNOSIS — Z87891 Personal history of nicotine dependence: Secondary | ICD-10-CM | POA: Diagnosis not present

## 2015-10-27 DIAGNOSIS — M25561 Pain in right knee: Secondary | ICD-10-CM

## 2015-10-27 DIAGNOSIS — W1789XA Other fall from one level to another, initial encounter: Secondary | ICD-10-CM | POA: Insufficient documentation

## 2015-10-27 DIAGNOSIS — J45909 Unspecified asthma, uncomplicated: Secondary | ICD-10-CM | POA: Diagnosis not present

## 2015-10-27 DIAGNOSIS — Z79899 Other long term (current) drug therapy: Secondary | ICD-10-CM | POA: Diagnosis not present

## 2015-10-27 DIAGNOSIS — S8991XA Unspecified injury of right lower leg, initial encounter: Secondary | ICD-10-CM | POA: Diagnosis not present

## 2015-10-27 MED ORDER — IBUPROFEN 800 MG PO TABS: 800.0000 mg | ORAL_TABLET | Freq: Three times a day (TID) | ORAL | Status: DC

## 2015-10-27 MED ORDER — OXYCODONE-ACETAMINOPHEN 5-325 MG PO TABS: 1.0000 | ORAL_TABLET | ORAL | Status: DC | PRN

## 2015-10-27 NOTE — Discharge Instructions (Signed)
You have been seen today for knee pain. Your imaging showed no abnormalities. Follow up with orthopedics as soon as possible in this matter. Follow up with PCP as needed. Return to ED should symptoms worsen. Ibuprofen or Tylenol for pain. Keep the extremity elevated. Do not drive while using pain medications such as Percocet.

## 2015-10-27 NOTE — ED Notes (Signed)
Patient presents for fall and right knee pain. Slipped on wooden slate. C/o right lateral and posterior knee pain. Rates pain 9/10. Denies numbness or tingling. Ambulatory to triage.

## 2015-10-27 NOTE — ED Provider Notes (Signed)
CSN: [id number]     Arrival date & time 10/27/15  2141 History   First MD Initiated Contact with Patient 10/27/15 2337     Chief Complaint  Patient presents with  . Knee Pain  . Fall     (Consider location/radiation/quality/duration/timing/severity/associated sxs/prior Treatment) HPI   [name] Gamesdward Victory is a 30 y.o. male, with a history of multiple knee surgeries and obesity, presenting to the ED with right knee pain since last night. Patient states that he was walking on a wet wooden pallet, when he slipped and twisted his right leg. Patient states that he did not fall to the ground but immediately had pain in his right knee. Patient states that he has previously injured his left knee multiple times including needing to have repairs to all of the ligaments. Patient states that his pain in his right knee feels like the pain associated with his previous left knee injuries. Patient rates the pain an 8 out of 10, describes it as throbbing, nonradiating. Patient denies LOC, head trauma, neuro deficits, or any other pain or complaints. Patient states he is able to bear weight on his right knee, but it is painful.    Past Medical History  Diagnosis Date  . Obesity   . Asthma   . Headache(784.0)   . Bleeding disorder (HCC)     pt reports hospitalized in 2013 for bleeding disorder  . Chicken pox   . Generalized headaches   . Genital warts   . OSA (obstructive sleep apnea) 11/11/2014   Past Surgical History  Procedure Laterality Date  . Knee surgery    . Arthroscopic repair acl     Family History  Problem Relation Age of Onset  . Hypertension Mother   . Arthritis Mother   . Heart disease Mother   . Drug abuse Mother     cocaine  . Diabetes Father   . Heart disease Father   . Drug abuse Father     cocaine  . Heart disease Maternal Grandmother   . Diabetes Paternal Grandmother   . Diabetes Paternal Grandfather   . Migraines Daughter   . Healthy Brother    Social History   Substance Use Topics  . Smoking status: Former Smoker -- 1.00 packs/day for 14 years    Types: Cigarettes    Quit date: 07/04/2015  . Smokeless tobacco: Never Used  . Alcohol Use: 0.0 oz/week    0 Standard drinks or equivalent per week     Comment: Rare    Review of Systems  Musculoskeletal: Positive for arthralgias (Right knee pain). Negative for back pain and neck pain.  Neurological: Negative for weakness and numbness.      Allergies  Review of patient's allergies indicates no known allergies.  Home Medications   Prior to Admission medications   Medication Sig Start Date End Date Taking? Authorizing Provider  Aspirin-Acetaminophen-Caffeine (EXCEDRIN PO) Take 3 tablets by mouth 2 (two) times daily as needed (headache).   Yes Historical Provider, MD  diphenhydrAMINE (BENADRYL) 25 MG tablet Take 25 mg by mouth every 6 (six) hours as needed.   Yes Historical Provider, MD  diphenhydramine-acetaminophen (TYLENOL PM) 25-500 MG TABS Take 1-3 tablets by mouth at bedtime as needed.   Yes Historical Provider, MD  propranolol ER (INDERAL LA) 80 MG 24 hr capsule Take 1 capsule (80 mg total) by mouth daily. 05/26/15  Yes Veryl SpeakGregory D Calone, FNP  ibuprofen (ADVIL,MOTRIN) 800 MG tablet Take 1 tablet (800 mg total) by mouth 3 (  three) times daily. 10/27/15   Kolden Dupee C Melizza Kanode, PA-C  oxyCODONE-acetaminophen (PERCOCET/ROXICET) 5-325 MG tablet Take 1-2 tablets by mouth every 4 (four) hours as needed for severe pain. 10/27/15   Tarik Teixeira C Abu Heavin, PA-C   BP 168/88 mmHg  Pulse 100  Temp(Src) 98.3 F (36.8 C) (Oral)  Resp 18  SpO2 98% Physical Exam  Constitutional: He appears well-developed and well-nourished.  HENT:  Head: Normocephalic and atraumatic.  Cardiovascular: Normal rate, regular rhythm and intact distal pulses.   Pulmonary/Chest: Effort normal.  Musculoskeletal: He exhibits tenderness (Right lateral knee).  Full ROM in all extremities and spine. No paraspinal tenderness. Some swelling noted to the  right knee. No laxity noted. Pain to the lateral right knee with varus stress. Patient is able to bear weight on the right knee but with pain.  Neurological: He is alert. He has normal reflexes.  No sensory deficits. Strength 5 out of 5 in the right leg.  Skin: Skin is warm and dry.  Nursing note and vitals reviewed.   ED Course  Procedures (including critical care time)  Imaging Review Dg Knee Complete 4 Views Right  10/27/2015  CLINICAL DATA:  Acute right knee pain after fall last night at home. Initial encounter. EXAM: RIGHT KNEE - COMPLETE 4+ VIEW COMPARISON:  None. FINDINGS: There is no evidence of fracture, dislocation, or joint effusion. There is no evidence of arthropathy or other focal bone abnormality. Soft tissues are unremarkable. IMPRESSION: No acute abnormality seen in the right knee. Electronically Signed   By: Lupita Raider, M.D.   On: 10/27/2015 22:41   I have personally reviewed and evaluated these images as part of my medical decision-making.   EKG Interpretation None      MDM   Final diagnoses:  Right knee pain    [name] [name] presents with right knee pain after a slip and misstep last night.  This patient has no abnormalities on the x-ray, but may have abnormalities in his ligaments or meniscus, most notably a possible partial LCL tear. Patient will be fitted with knee immobilizer, given crutches, pain management, and referral to orthopedics. Patient states that he prefers Dr. Devonne Doughty from [facility] orthopedics. Patient was offered pain management here in the ED, but states that he needs to drive because his wife cannot, thus declined narcotic pain management. Patient did, however, accept ibuprofen. The patient was given instructions for home care as well as return precautions. Patient voices understanding of these instructions, accepts the plan, and is comfortable with discharge.    Anselm Pancoast, PA-C 10/28/15 0003  Dione Booze, MD 10/28/15 [phone]

## 2015-10-28 MED ORDER — IBUPROFEN 800 MG PO TABS
800.0000 mg | ORAL_TABLET | Freq: Once | ORAL | Status: AC
  Administered 2015-10-28: 800 mg via ORAL
  Filled 2015-10-28: qty 1

## 2015-11-01 NOTE — Telephone Encounter (Signed)
LVM for pt to return call

## 2015-11-02 ENCOUNTER — Ambulatory Visit (INDEPENDENT_AMBULATORY_CARE_PROVIDER_SITE_OTHER): Payer: BLUE CROSS/BLUE SHIELD | Admitting: Family

## 2015-11-02 ENCOUNTER — Encounter: Payer: Self-pay | Admitting: Family

## 2015-11-02 VITALS — BP 128/80 | HR 95 | Temp 98.0°F | Resp 18 | Ht 70.0 in | Wt 399.0 lb

## 2015-11-02 DIAGNOSIS — R21 Rash and other nonspecific skin eruption: Secondary | ICD-10-CM | POA: Insufficient documentation

## 2015-11-02 MED ORDER — DOXYCYCLINE HYCLATE 100 MG PO TABS: 100.0000 mg | ORAL_TABLET | Freq: Two times a day (BID) | ORAL | Status: DC

## 2015-11-02 NOTE — Telephone Encounter (Signed)
Called and spoke with patient. Patient states he has not received his CPAP. States he spoke with a representative with [employer] and was suppose to return her call. He explained that he lost [employer]'s number and was unsure of the company's name to look them up. I offered him [employer]'s number but he stated he was out to lunch and was unable to write it down but would call our office back today before 5pm. Will await call back.

## 2015-11-02 NOTE — Progress Notes (Signed)
Pre visit review using our clinic review tool, if applicable. No additional management support is needed unless otherwise documented below in the visit note. 

## 2015-11-02 NOTE — Progress Notes (Signed)
Subjective:    Patient ID: [name] [name], male    DOB: 17-Aug-1986, 30 y.o.   MRN: [id number]  Chief Complaint  Patient presents with  . Leg Pain    has a "cluster" of bumps on the back of his left leg that are causing pain, x3 days    HPI:  [name] [name] is a 30 y.o. male who  has a past medical history of Obesity; Asthma; Headache(784.0); Bleeding disorder (HCC); Chicken pox; Generalized headaches; Genital warts; and OSA (obstructive sleep apnea) (11/11/2014). and presents today for an acute office visit.  This is a new problem. Associated symptom of a bump located on the back of his right thigh which has been going on for about 3 days. There was no trauma to the area. Indicates there has been a change in size since he initially noted it. Modifying factors include an OTC itching cream which did not help very much. There is pain and uncomfortable when he sits and walks. Denies fevers. Described as a "cluster of bumps" with some redness around it.   No Known Allergies   Current Outpatient Prescriptions on File Prior to Visit  Medication Sig Dispense Refill  . Aspirin-Acetaminophen-Caffeine (EXCEDRIN PO) Take 3 tablets by mouth 2 (two) times daily as needed (headache).    . diphenhydrAMINE (BENADRYL) 25 MG tablet Take 25 mg by mouth every 6 (six) hours as needed.    . diphenhydramine-acetaminophen (TYLENOL PM) 25-500 MG TABS Take 1-3 tablets by mouth at bedtime as needed.    Marland Kitchen ibuprofen (ADVIL,MOTRIN) 800 MG tablet Take 1 tablet (800 mg total) by mouth 3 (three) times daily. 21 tablet 0  . oxyCODONE-acetaminophen (PERCOCET/ROXICET) 5-325 MG tablet Take 1-2 tablets by mouth every 4 (four) hours as needed for severe pain. 15 tablet 0  . propranolol ER (INDERAL LA) 80 MG 24 hr capsule Take 1 capsule (80 mg total) by mouth daily. 30 capsule 1   No current facility-administered medications on file prior to visit.     Past Surgical History  Procedure Laterality Date  . Knee surgery      . Arthroscopic repair acl       Review of Systems  Constitutional: Negative for fever and chills.  Skin: Positive for color change and rash.      Objective:    BP 128/80 mmHg  Pulse 95  Temp(Src) 98 F (36.7 C) (Oral)  Resp 18  Ht  (1.778 m)  Wt 399 lb (180.985 kg)  BMI 57.25 kg/m2  SpO2 97% Nursing note and vital signs reviewed.  Physical Exam  Constitutional: He is oriented to person, place, and time. He appears well-developed and well-nourished. No distress.  Cardiovascular: Normal rate, regular rhythm, normal heart sounds and intact distal pulses.   Pulmonary/Chest: Effort normal and breath sounds normal.  Neurological: He is alert and oriented to person, place, and time.  Skin: Skin is warm and dry.  6 mm x 8 mm oblong reddened area with firm base on an errythmatous base. There was a vesicular rash located on the top aspect located on the right posterior thigh.   Psychiatric: He has a normal mood and affect. His behavior is normal. Judgment and thought content normal.       Assessment & Plan:   Problem List Items Addressed This Visit      Musculoskeletal and Integument   Rash and nonspecific skin eruption - Primary    Symptoms mixed with questionable rash/abscess. Concern for possible staph infection, although given  vesicular rash question possible shingles although unlikely. Start doxycyline. Follow up if symptoms do not improve with medication and may require incision and drainage or referral to dermatology.       Relevant Medications   doxycycline (VIBRA-TABS) 100 MG tablet

## 2015-11-02 NOTE — Assessment & Plan Note (Signed)
Symptoms mixed with questionable rash/abscess. Concern for possible staph infection, although given vesicular rash question possible shingles although unlikely. Start doxycyline. Follow up if symptoms do not improve with medication and may require incision and drainage or referral to dermatology.

## 2015-11-02 NOTE — Patient Instructions (Signed)
Thank you for choosing [facility].  Summary/Instructions:  Your prescription(s) have been submitted to your pharmacy or been printed and provided for you. Please take as directed and contact our office if you believe you are having problem(s) with the medication(s) or have any questions.  If your symptoms worsen or fail to improve, please contact our office for further instruction, or in case of emergency go directly to the emergency room at the closest medical facility.   Please start taking the doxycycline. If your symptoms worsen, please let us know.

## 2015-11-03 ENCOUNTER — Encounter (HOSPITAL_COMMUNITY): Payer: Self-pay | Admitting: Emergency Medicine

## 2015-11-03 ENCOUNTER — Emergency Department (HOSPITAL_COMMUNITY)
Admission: EM | Admit: 2015-11-03 | Discharge: 2015-11-03 | Disposition: A | Discharge status: Home / Self Care | Payer: BLUE CROSS/BLUE SHIELD | Attending: Emergency Medicine | Admitting: Emergency Medicine

## 2015-11-03 DIAGNOSIS — Z79899 Other long term (current) drug therapy: Secondary | ICD-10-CM | POA: Diagnosis not present

## 2015-11-03 DIAGNOSIS — Z862 Personal history of diseases of the blood and blood-forming organs and certain disorders involving the immune mechanism: Secondary | ICD-10-CM | POA: Diagnosis not present

## 2015-11-03 DIAGNOSIS — J45909 Unspecified asthma, uncomplicated: Secondary | ICD-10-CM | POA: Diagnosis not present

## 2015-11-03 DIAGNOSIS — Z8669 Personal history of other diseases of the nervous system and sense organs: Secondary | ICD-10-CM | POA: Insufficient documentation

## 2015-11-03 DIAGNOSIS — L03116 Cellulitis of left lower limb: Secondary | ICD-10-CM | POA: Insufficient documentation

## 2015-11-03 DIAGNOSIS — Z87891 Personal history of nicotine dependence: Secondary | ICD-10-CM | POA: Diagnosis not present

## 2015-11-03 DIAGNOSIS — E669 Obesity, unspecified: Secondary | ICD-10-CM | POA: Diagnosis not present

## 2015-11-03 DIAGNOSIS — Z8619 Personal history of other infectious and parasitic diseases: Secondary | ICD-10-CM | POA: Insufficient documentation

## 2015-11-03 DIAGNOSIS — L02415 Cutaneous abscess of right lower limb: Secondary | ICD-10-CM | POA: Diagnosis present

## 2015-11-03 LAB — CBC WITH DIFFERENTIAL/PLATELET
BASOS ABS: 0 10*3/uL (ref 0.0–0.1)
BASOS PCT: 0 %
EOS ABS: 0.1 10*3/uL (ref 0.0–0.7)
EOS PCT: 1 %
HCT: 46.6 % (ref 39.0–52.0)
Hemoglobin: 15.5 g/dL (ref 13.0–17.0)
Lymphocytes Relative: 22 %
Lymphs Abs: 1.9 10*3/uL (ref 0.7–4.0)
MCH: 28.2 pg (ref 26.0–34.0)
MCHC: 33.3 g/dL (ref 30.0–36.0)
MCV: 84.7 fL (ref 78.0–100.0)
MONO ABS: 0.6 10*3/uL (ref 0.1–1.0)
Monocytes Relative: 7 %
Neutro Abs: 6 10*3/uL (ref 1.7–7.7)
Neutrophils Relative %: 70 %
PLATELETS: 233 10*3/uL (ref 150–400)
RBC: 5.5 MIL/uL (ref 4.22–5.81)
RDW: 13.5 % (ref 11.5–15.5)
WBC: 8.6 10*3/uL (ref 4.0–10.5)

## 2015-11-03 LAB — BASIC METABOLIC PANEL
ANION GAP: 9 (ref 5–15)
BUN: 12 mg/dL (ref 6–20)
CALCIUM: 9.6 mg/dL (ref 8.9–10.3)
CO2: 28 mmol/L (ref 22–32)
Chloride: 103 mmol/L (ref 101–111)
Creatinine, Ser: 1.18 mg/dL (ref 0.61–1.24)
GLUCOSE: 95 mg/dL (ref 65–99)
Potassium: 3.9 mmol/L (ref 3.5–5.1)
Sodium: 140 mmol/L (ref 135–145)

## 2015-11-03 MED ORDER — SODIUM CHLORIDE 0.9 % IV SOLN
Freq: Once | INTRAVENOUS | Status: AC
  Administered 2015-11-03: 20:00:00 via INTRAVENOUS

## 2015-11-03 MED ORDER — DEXTROSE 5 % IV SOLN
1.0000 g | Freq: Once | INTRAVENOUS | Status: AC
  Administered 2015-11-03: 1 g via INTRAVENOUS
  Filled 2015-11-03: qty 10

## 2015-11-03 NOTE — Discharge Instructions (Signed)
Cellulitis °Cellulitis is an infection of the skin and the tissue beneath it. The infected area is usually red and tender. Cellulitis occurs most often in the arms and lower legs.  °CAUSES  °Cellulitis is caused by bacteria that enter the skin through cracks or cuts in the skin. The most common types of bacteria that cause cellulitis are staphylococci and streptococci. °SIGNS AND SYMPTOMS  °· Redness and warmth. °· Swelling. °· Tenderness or pain. °· Fever. °DIAGNOSIS  °Your health care provider can usually determine what is wrong based on a physical exam. Blood tests may also be done. °TREATMENT  °Treatment usually involves taking an antibiotic medicine. °HOME CARE INSTRUCTIONS  °· Take your antibiotic medicine as directed by your health care provider. Finish the antibiotic even if you start to feel better. °· Keep the infected arm or leg elevated to reduce swelling. °· Apply a warm cloth to the affected area up to 4 times per day to relieve pain. °· Take medicines only as directed by your health care provider. °· Keep all follow-up visits as directed by your health care provider. °SEEK MEDICAL CARE IF:  °· You notice red streaks coming from the infected area. °· Your red area gets larger or turns dark in color. °· Your bone or joint underneath the infected area becomes painful after the skin has healed. °· Your infection returns in the same area or another area. °· You notice a swollen bump in the infected area. °· You develop new symptoms. °· You have a fever. °SEEK IMMEDIATE MEDICAL CARE IF:  °· You feel very sleepy. °· You develop vomiting or diarrhea. °· You have a general ill feeling (malaise) with muscle aches and pains. °  °This information is not intended to replace advice given to you by your health care provider. Make sure you discuss any questions you have with your health care provider. °  °Document Released: 07/11/2005 Document Revised: 06/22/2015 Document Reviewed: 12/17/2011 °Elsevier Interactive  Patient Education ©2016 [employer] ° °Abscess °An abscess is an infected area that contains a collection of pus and debris. It can occur in almost any part of the body. An abscess is also known as a furuncle or boil. °CAUSES  °An abscess occurs when tissue gets infected. This can occur from blockage of oil or sweat glands, infection of hair follicles, or a minor injury to the skin. As the body tries to fight the infection, pus collects in the area and creates pressure under the skin. This pressure causes pain. People with weakened immune systems have difficulty fighting infections and get certain abscesses more often.  °SYMPTOMS °Usually an abscess develops on the skin and becomes a painful mass that is red, warm, and tender. If the abscess forms under the skin, you may feel a moveable soft area under the skin. Some abscesses break open (rupture) on their own, but most will continue to get worse without care. The infection can spread deeper into the body and eventually into the bloodstream, causing you to feel ill.  °DIAGNOSIS  °Your caregiver will take your medical history and perform a physical exam. A sample of fluid may also be taken from the abscess to determine what is causing your infection. °TREATMENT  °Your caregiver may prescribe antibiotic medicines to fight the infection. However, taking antibiotics alone usually does not cure an abscess. Your caregiver may need to make a small cut (incision) in the abscess to drain the pus. In some cases, gauze is packed into the abscess to reduce   pain and to continue draining the area. °HOME CARE INSTRUCTIONS  °· Only take over-the-counter or prescription medicines for pain, discomfort, or fever as directed by your caregiver. °· If you were prescribed antibiotics, take them as directed. Finish them even if you start to feel better. °· If gauze is used, follow your caregiver's directions for changing the gauze. °· To avoid spreading the infection: °¨ Keep your  draining abscess covered with a bandage. °¨ Wash your hands well. °¨ Do not share personal care items, towels, or whirlpools with others. °¨ Avoid skin contact with others. °· Keep your skin and clothes clean around the abscess. °· Keep all follow-up appointments as directed by your caregiver. °SEEK MEDICAL CARE IF:  °· You have increased pain, swelling, redness, fluid drainage, or bleeding. °· You have muscle aches, chills, or a general ill feeling. °· You have a fever. °MAKE SURE YOU:  °· Understand these instructions. °· Will watch your condition. °· Will get help right away if you are not doing well or get worse. °  °This information is not intended to replace advice given to you by your health care provider. Make sure you discuss any questions you have with your health care provider. °  °Document Released: 07/11/2005 Document Revised: 04/01/2012 Document Reviewed: 12/14/2011 °Elsevier Interactive Patient Education ©2016 Elsevier Inc. ° °

## 2015-11-03 NOTE — ED Provider Notes (Signed)
CSN: [id number]     Arrival date & time 11/03/15  1812 History  By signing my name below, I, Soijett Blue, attest that this documentation has been prepared under the direction and in the presence of Langston Masker, PA-C Electronically Signed: Soijett Blue, ED Scribe. 11/03/2015. 7:13 PM.   Chief Complaint  Patient presents with  . Abscess      The history is provided by the patient. No language interpreter was used.   [name] [name] is a 30 y.o. male who presents to the Emergency Department complaining of posterior right leg abscess onset 4 days. Pt went to his PCP yesterday and was Rx doxycyline for his symptoms. Pt has taken 3 doses of the abx currently. He reports that since going to his PCP yesterday the area has enlarged. Pt denies a PMHx of DM at this time. He states that he has tried Rx abx for the relief of his symptoms. He denies fever, drainage, and any other symptoms. Denies allergies to medications.   Past Medical History  Diagnosis Date  . Obesity   . Asthma   . Headache(784.0)   . Bleeding disorder (HCC)     pt reports hospitalized in 2013 for bleeding disorder  . Chicken pox   . Generalized headaches   . Genital warts   . OSA (obstructive sleep apnea) 11/11/2014   Past Surgical History  Procedure Laterality Date  . Knee surgery    . Arthroscopic repair acl     Family History  Problem Relation Age of Onset  . Hypertension Mother   . Arthritis Mother   . Heart disease Mother   . Drug abuse Mother     cocaine  . Diabetes Father   . Heart disease Father   . Drug abuse Father     cocaine  . Heart disease Maternal Grandmother   . Diabetes Paternal Grandmother   . Diabetes Paternal Grandfather   . Migraines Daughter   . Healthy Brother    Social History  Substance Use Topics  . Smoking status: Former Smoker -- 1.00 packs/day for 14 years    Types: Cigarettes    Quit date: 07/04/2015  . Smokeless tobacco: Never Used  . Alcohol Use: 0.0 oz/week    0 Standard  drinks or equivalent per week     Comment: Rare    Review of Systems  Constitutional: Negative for fever and chills.  Musculoskeletal: Negative for gait problem.  Skin: Negative for color change.       Abscess to posterior right leg without drainage  All other systems reviewed and are negative.     Allergies  Review of patient's allergies indicates no known allergies.  Home Medications   Prior to Admission medications   Medication Sig Start Date End Date Taking? Authorizing Provider  Aspirin-Acetaminophen-Caffeine (EXCEDRIN PO) Take 3 tablets by mouth 2 (two) times daily as needed (headache).    Historical Provider, MD  diphenhydrAMINE (BENADRYL) 25 MG tablet Take 25 mg by mouth every 6 (six) hours as needed.    Historical Provider, MD  diphenhydramine-acetaminophen (TYLENOL PM) 25-500 MG TABS Take 1-3 tablets by mouth at bedtime as needed.    Historical Provider, MD  doxycycline (VIBRA-TABS) 100 MG tablet Take 1 tablet (100 mg total) by mouth 2 (two) times daily. 11/02/15   Veryl Speak, FNP  ibuprofen (ADVIL,MOTRIN) 800 MG tablet Take 1 tablet (800 mg total) by mouth 3 (three) times daily. 10/27/15   Shawn C Joy, PA-C  oxyCODONE-acetaminophen (PERCOCET/ROXICET) 5-325  MG tablet Take 1-2 tablets by mouth every 4 (four) hours as needed for severe pain. 10/27/15   Shawn C Joy, PA-C  propranolol ER (INDERAL LA) 80 MG 24 hr capsule Take 1 capsule (80 mg total) by mouth daily. 05/26/15   Veryl Speak, FNP   BP 160/88 mmHg  Pulse 88  Temp(Src) 98.6 F (37 C) (Oral)  Resp 20  SpO2 99% Physical Exam  Constitutional: He is oriented to person, place, and time. He appears well-developed and well-nourished. No distress.  HENT:  Head: Normocephalic and atraumatic.  Eyes: EOM are normal.  Neck: Neck supple.  Cardiovascular: Normal rate.   Pulmonary/Chest: Effort normal. No respiratory distress.  Abdominal: Soft. There is no tenderness.  Musculoskeletal: Normal range of motion.   Neurological: He is alert and oriented to person, place, and time.  Skin: Skin is warm and dry. Rash noted. Rash is pustular. There is erythema.  Right mid posterior thigh 5 cm area of yellow discolored pustules. 10 cm area of erythema surrounding.   Psychiatric: He has a normal mood and affect. His behavior is normal.  Nursing note and vitals reviewed.   ED Course  Procedures (including critical care time) DIAGNOSTIC STUDIES: Oxygen Saturation is 99% on RA, nl by my interpretation.    COORDINATION OF CARE: 7:08 PM Discussed treatment plan with pt at bedside which includes labs, IV abx, and pt agreed to plan.    Labs Review Labs Reviewed  CBC WITH DIFFERENTIAL/PLATELET  BASIC METABOLIC PANEL    Imaging Review No results found. I have personally reviewed and evaluated these images and lab results as part of my medical decision-making.   EKG Interpretation None      MDM   Final diagnoses:  None   Pt's care turned over to [facility]   I personally performed the services in this documentation, which was scribed in my presence.  The recorded information has been reviewed and considered.   Barnet Pall. Elson Areas, PA-C 11/03/15 1955  Lonia Skinner Phoenix, PA-C 11/03/15 1955  Eber Hong, MD 11/05/15 [phone]

## 2015-11-03 NOTE — ED Notes (Signed)
Pt c/o pain and a "cluster of bumps" in posterior Right leg x 4 days

## 2015-11-03 NOTE — ED Provider Notes (Signed)
  Physical Exam  BP 160/88 mmHg  Pulse 88  Temp(Src) 98.6 F (37 C) (Oral)  Resp 20  SpO2 99%  Physical Exam Skin: Right lower extremity remarkable for 10 cm area of cellulitis, no palpable fluctuance, doubt abscess    ED Course  Procedures  MDM Cellulitis of Right lower extremity.  No obvious abscess.  Has been on abx (doxy) for 3 total doses.  VSS.  CBC and BMP are perfect.  Recommend close follow-up.  Return for worsening symptoms.      Roxy Horseman, PA-C 11/03/15 2014  Eber Hong, MD 11/05/15 [phone]

## 2015-11-07 NOTE — Telephone Encounter (Signed)
Called and spoke with patient. He states he never went to pick it up for [facility] and stated he was going through several different medical issues at this time and stated he would return our call once he received the CPAP for a 30 day follow up. Pt voiced understanding and had no further questions. Nothing further needed at this time.

## 2015-11-10 ENCOUNTER — Ambulatory Visit: Payer: Self-pay | Admitting: Neurology

## 2015-11-24 NOTE — H&P (Signed)
  [name] [name] is an 30 y.o. male.    Chief Complaint: right knee pain  HPI: Pt is a 30 y.o. male complaining of right knee pain for multiple years. Pain had continually increased since the beginning. X-rays in the clinic show meniscal tear right knee. Pt has tried various conservative treatments which have failed to alleviate their symptoms, including injections and therapy. Various options are discussed with the patient. Risks, benefits and expectations were discussed with the patient. Patient understand the risks, benefits and expectations and wishes to proceed with surgery.   PCP:  Jeanine Luz, FNP  D/C Plans: Home  PMH: Past Medical History  Diagnosis Date  . Obesity   . Asthma   . Headache(784.0)   . Bleeding disorder (HCC)     pt reports hospitalized in 2013 for bleeding disorder  . Chicken pox   . Generalized headaches   . Genital warts   . OSA (obstructive sleep apnea) 11/11/2014    PSH: Past Surgical History  Procedure Laterality Date  . Knee surgery    . Arthroscopic repair acl      Social History:  reports that he quit smoking about 4 months ago. His smoking use included Cigarettes. He has a 14 pack-year smoking history. He has never used smokeless tobacco. He reports that he drinks alcohol. He reports that he does not use illicit drugs.  Allergies:  No Known Allergies  Medications: No current facility-administered medications for this encounter.   Current Outpatient Prescriptions  Medication Sig Dispense Refill  . Aspirin-Acetaminophen-Caffeine (EXCEDRIN PO) Take 3 tablets by mouth 2 (two) times daily as needed (headache).    . diphenhydrAMINE (BENADRYL) 25 MG tablet Take 25 mg by mouth every 6 (six) hours as needed.    . diphenhydramine-acetaminophen (TYLENOL PM) 25-500 MG TABS Take 1-3 tablets by mouth at bedtime as needed.    . doxycycline (VIBRA-TABS) 100 MG tablet Take 1 tablet (100 mg total) by mouth 2 (two) times daily. 20 tablet 0  . ibuprofen  (ADVIL,MOTRIN) 800 MG tablet Take 1 tablet (800 mg total) by mouth 3 (three) times daily. 21 tablet 0  . oxyCODONE-acetaminophen (PERCOCET/ROXICET) 5-325 MG tablet Take 1-2 tablets by mouth every 4 (four) hours as needed for severe pain. 15 tablet 0  . propranolol ER (INDERAL LA) 80 MG 24 hr capsule Take 1 capsule (80 mg total) by mouth daily. 30 capsule 1    No results found for this or any previous visit (from the past 48 hour(s)). No results found.  ROS: Pain with rom of the right lower extremity  Physical Exam:  Alert and oriented 30 y.o. male in no acute distress Cranial nerves 2-12 intact Cervical spine: full rom with no tenderness, nv intact distally Chest: active breath sounds bilaterally, no wheeze rhonchi or rales Heart: regular rate and rhythm, no murmur Abd: non tender non distended with active bowel sounds Hip is stable with rom  Right knee pain with ambulation Pain to medial joint line nv intact distally  Assessment/Plan Assessment: right knee meniscal tear  Plan: Patient will undergo a right knee scope by Dr. Ranell Patrick at [facility]. Risks benefits and expectations were discussed with the patient. Patient understand risks, benefits and expectations and wishes to proceed.

## 2015-12-05 NOTE — Pre-Procedure Instructions (Addendum)
[name] [name]  12/05/2015     Your procedure is scheduled on March 3.  Report to [facility] Admitting at 8:30 A.M.  Call this number if you have problems the morning of surgery:  [phone]   Remember:  Do not eat food or drink liquids after midnight.  Take these medicines the morning of surgery with A SIP OF WATER Propranolol,    STOP Excedrin February 26   STOP/ Do not take Aspirin, Aleve, Naproxen, Advil, Ibuprofen, Motrin, Vitamins, Herbs, or Supplements starting February 26   Do not wear jewelry, make-up or nail polish.  Do not wear lotions, powders, or perfumes.  You may wear deodorant.  Do not shave 48 hours prior to surgery.  Men may shave face and neck.  Do not bring valuables to the hospital.  [facility] is not responsible for any belongings or valuables.  Contacts, dentures or bridgework may not be worn into surgery.  Leave your suitcase in the car.  After surgery it may be brought to your room.  For patients admitted to the hospital, discharge time will be determined by your treatment team.  Patients discharged the day of surgery will not be allowed to drive home.   Name and phone number of your driver:   Family/ Friend  Please read over the following fact sheets that you were given. Special Instructions: [place] - Preparing for Surgery  Before surgery, you can play an important role.  Because skin is not sterile, your skin needs to be as free of germs as possible.  You can reduce the number of germs on you skin by washing with CHG (chlorahexidine gluconate) soap before surgery.  CHG is an antiseptic cleaner which kills germs and bonds with the skin to continue killing germs even after washing.  Please DO NOT use if you have an allergy to CHG or antibacterial soaps.  If your skin becomes reddened/irritated stop using the CHG and inform your nurse when you arrive at Short Stay.  Do not shave (including legs and underarms) for at least 48 hours  prior to the first CHG shower.  You may shave your face.  Please follow these instructions carefully:   1.  Shower with CHG Soap the night before surgery and the morning of Surgery.  2.  If you choose to wash your hair, wash your hair first as usual with your normal shampoo.  3.  After you shampoo, rinse your hair and body thoroughly to remove the Shampoo.  4.  Use CHG as you would any other liquid soap.  You can apply chg directly  to the skin and wash gently with scrungie or a clean washcloth.  5.  Apply the CHG Soap to your body ONLY FROM THE NECK DOWN.  Do not use on open wounds or open sores.  Avoid contact with your eyes ears, mouth and genitals (private parts).  Wash genitals (private parts)       with your normal soap.  6.  Wash thoroughly, paying special attention to the area where your surgery will be performed.  7.  Thoroughly rinse your body with warm water from the neck down.  8.  DO NOT shower/wash with your normal soap after using and rinsing off the CHG Soap.  9.  Pat yourself dry with a clean towel.            10.  Wear clean pajamas.  11.  Place clean sheets on your bed the night of your first shower and do not sleep with pets.  Day of Surgery  Do not apply any lotions/deodorants the morning of surgery.  Please wear clean clothes to the hospital/surgery center.  Pain Booklet, Coughing and Deep Breathing and Surgical Site Infection Prevention

## 2015-12-06 ENCOUNTER — Encounter (HOSPITAL_COMMUNITY)
Admission: RE | Admit: 2015-12-06 | Discharge: 2015-12-06 | Disposition: A | Discharge status: Home / Self Care | Payer: BLUE CROSS/BLUE SHIELD | Source: Ambulatory Visit | Attending: Orthopedic Surgery | Admitting: Orthopedic Surgery

## 2015-12-06 ENCOUNTER — Encounter (HOSPITAL_COMMUNITY): Payer: Self-pay

## 2015-12-06 DIAGNOSIS — Z79899 Other long term (current) drug therapy: Secondary | ICD-10-CM | POA: Diagnosis not present

## 2015-12-06 DIAGNOSIS — F17210 Nicotine dependence, cigarettes, uncomplicated: Secondary | ICD-10-CM | POA: Insufficient documentation

## 2015-12-06 DIAGNOSIS — X58XXXA Exposure to other specified factors, initial encounter: Secondary | ICD-10-CM | POA: Diagnosis not present

## 2015-12-06 DIAGNOSIS — Z01812 Encounter for preprocedural laboratory examination: Secondary | ICD-10-CM | POA: Diagnosis not present

## 2015-12-06 DIAGNOSIS — Z6841 Body Mass Index (BMI) 40.0 and over, adult: Secondary | ICD-10-CM | POA: Diagnosis not present

## 2015-12-06 DIAGNOSIS — G4733 Obstructive sleep apnea (adult) (pediatric): Secondary | ICD-10-CM | POA: Diagnosis not present

## 2015-12-06 DIAGNOSIS — S83206A Unspecified tear of unspecified meniscus, current injury, right knee, initial encounter: Secondary | ICD-10-CM | POA: Insufficient documentation

## 2015-12-06 DIAGNOSIS — R9431 Abnormal electrocardiogram [ECG] [EKG]: Secondary | ICD-10-CM | POA: Diagnosis not present

## 2015-12-06 DIAGNOSIS — Z01818 Encounter for other preprocedural examination: Secondary | ICD-10-CM | POA: Insufficient documentation

## 2015-12-06 DIAGNOSIS — I10 Essential (primary) hypertension: Secondary | ICD-10-CM | POA: Diagnosis not present

## 2015-12-06 DIAGNOSIS — J45909 Unspecified asthma, uncomplicated: Secondary | ICD-10-CM | POA: Insufficient documentation

## 2015-12-06 HISTORY — DX: Adverse effect of unspecified anesthetic, initial encounter: T41.45XA

## 2015-12-06 HISTORY — DX: Nausea with vomiting, unspecified: R11.2

## 2015-12-06 HISTORY — DX: Other specified postprocedural states: Z98.890

## 2015-12-06 HISTORY — DX: Unspecified osteoarthritis, unspecified site: M19.90

## 2015-12-06 HISTORY — DX: Essential (primary) hypertension: I10

## 2015-12-06 HISTORY — DX: Other complications of anesthesia, initial encounter: T88.59XA

## 2015-12-06 LAB — CBC
HCT: 45.6 % (ref 39.0–52.0)
Hemoglobin: 15.1 g/dL (ref 13.0–17.0)
MCH: 27.9 pg (ref 26.0–34.0)
MCHC: 33.1 g/dL (ref 30.0–36.0)
MCV: 84.3 fL (ref 78.0–100.0)
PLATELETS: 224 10*3/uL (ref 150–400)
RBC: 5.41 MIL/uL (ref 4.22–5.81)
RDW: 13.9 % (ref 11.5–15.5)
WBC: 10.1 10*3/uL (ref 4.0–10.5)

## 2015-12-06 LAB — BASIC METABOLIC PANEL
Anion gap: 10 (ref 5–15)
BUN: 13 mg/dL (ref 6–20)
CALCIUM: 9.5 mg/dL (ref 8.9–10.3)
CO2: 26 mmol/L (ref 22–32)
CREATININE: 1.02 mg/dL (ref 0.61–1.24)
Chloride: 104 mmol/L (ref 101–111)
GFR calc non Af Amer: >60 mL/min (ref >60)
Glucose, Bld: 110 mg/dL — ABNORMAL HIGH (ref 65–99)
Potassium: 4.6 mmol/L (ref 3.5–5.1)
SODIUM: 140 mmol/L (ref 135–145)

## 2015-12-07 NOTE — Progress Notes (Addendum)
Anesthesia Chart Review: Patient is a 30 year old male scheduled for right knee arthroscopy on 12/16/15 by Dr. Ranell Patrick.  History includes smoking, post-operative N/V, HTN, OSA (no CPAP yet), morbid obesity (BMI 56), asthma, arthritis, migraine headaches (Dr. Nita Sickle), PCL/ACL repair 07/08/02 [facility], Dr. Thurston Hole). PCP is Jeanine Luz, FNP. Seen by Dr. Craige Cotta for OSA. He has not picked up CPAP from [facility].  He reported being hospitalized for bleeding disorder in 2013. I called and left a message for patient to call me to inquire about details. He has no admission at [facility].  Meds include Excedrin, Tylenol PM, Inderal LA (for headaches).  12/06/15 EKG: NSR, possible anterior infarct (age undetermined), poor r wave progression. EKG appears stable when compared to tracing from 05/05/14.  07/2015 Sleep study showed moderate OSA (scanned under Media tab).  Preoperative labs noted.   May need to stay overnight due to untreated OSA. Will plan to address with patient once he calls back to discuss "bleeding disorder."  Velna Ochs [facility] Short Stay Center/Anesthesiology Phone [phone] 12/07/2015 6:02 PM  Addendum: I spoke with patient late yesterday. He reports he was seen twice at least twice in one of [facility]'s ED's for hemoptysis and/or epistaxis. He remembers at least one episode of epistaxis 4-5 year ago which stopped with packing. He had one episode of coughing up a blood "clot" 04/2014 and a more significant episode in 7013 where he had hemoptysis (with coughing) simultaneously with epistaxis. He was not admitted after any of these occasions. He thinks he was either told to follow-up with his PCP or a specialist for evaluation of "hemophilia." He never saw a hematologist. He denied known significant or prolonged bleeding with his prior surgeries. Minimal EBL listed on 07/10/01 operative report, but not documented on the 07/08/02 report. He had a more recent surgery ~ 2010/2011 on his left  knee (he thinks with Dr. Ranell Patrick). He denied any known family history of bleeding disorders. He drinks ETOH "occasionally", but denied illicit drug use. He denied known liver disease. He was not on any blood thinners when his bleeding events occurred. He denied bruising easily. He does continue to smoke ~ 10 cigarettes per day. No lung mass was seen on CXR when he was evaluated for hemoptysis in 04/2014. He denied CP, SOB, syncope, hematuria, hematochezia, fever. He denied prior cardiac studies such as echo or stress test.  I discussed with patient that: -  I will need to review his ED records to get more insight into his bleeding episodes. I will need to review this information with one of our anesthesiologist to see if he will require further evaluation (ie, hematologist) prior to his surgery.  - He may need to stay overnight since he has untreated OSA. He reports life has just gotten busy, and he hasn't picked up his CPAP machine yet.  There are the pertinent ED visits I found in Epic: - 05/05/14: Spitting up blood: 2-3 episodes of scant amount of dark read hemoptysis at home and on in the ED. He is a smoker. CXR was negative. He was discharged home. - 03/31/13: Epistaxis: Nose bleed that day and the day prior that lasted for approximately one hour each. Bleeding controlled upon arrival to ED. Discharged home. - 06/28/06: Hemoptysis: Was driving and felt tingling in the back of his throat and then started coughing up large amount of blood and clots, including coming from his nose. He reported episode lasted for 30 minutes. No further bleeding noted  in the ED. CBC, BMET, PT/PTT WNL. CXR was negative. PCP follow-up recommended.  I reviewed with anesthesiologist Dr. Aleene Davidson patients history of hemoptysis and epistaxis along with patient's input from my 12/08/15 conversation with him. Patient had previous normal CBC, PT/PTT and CXR during these episodes. Patient has not had any recent episodes and denied  excessive bleeding with prior surgeries. If no acute changes then would anticipate that he could proceed as planned. A determination of need for overnight stay will be made on the day of surgery.   Velna Ochs [facility] Short Stay Center/Anesthesiology Phone [phone] 12/09/2015 11:56 AM

## 2015-12-09 ENCOUNTER — Encounter (HOSPITAL_COMMUNITY): Payer: Self-pay

## 2015-12-15 MED ORDER — CHLORHEXIDINE GLUCONATE 4 % EX LIQD: 60.0000 mL | Freq: Once | CUTANEOUS | Status: DC

## 2015-12-15 MED ORDER — CEFAZOLIN SODIUM 10 G IJ SOLR
3.0000 g | INTRAMUSCULAR | Status: AC
  Administered 2015-12-16: 3 g via INTRAVENOUS
  Filled 2015-12-15: qty 3000

## 2015-12-16 ENCOUNTER — Encounter (HOSPITAL_COMMUNITY): Payer: Self-pay | Admitting: Certified Registered Nurse Anesthetist

## 2015-12-16 ENCOUNTER — Encounter (HOSPITAL_COMMUNITY)
Admission: RE | Disposition: A | Discharge status: Home / Self Care | Payer: Self-pay | Source: Ambulatory Visit | Attending: Orthopedic Surgery

## 2015-12-16 ENCOUNTER — Ambulatory Visit (HOSPITAL_COMMUNITY)
Admission: RE | Admit: 2015-12-16 | Discharge: 2015-12-16 | Disposition: A | Discharge status: Home / Self Care | Payer: BLUE CROSS/BLUE SHIELD | Source: Ambulatory Visit | Attending: Orthopedic Surgery | Admitting: Orthopedic Surgery

## 2015-12-16 ENCOUNTER — Ambulatory Visit (HOSPITAL_COMMUNITY): Payer: BLUE CROSS/BLUE SHIELD | Admitting: Certified Registered Nurse Anesthetist

## 2015-12-16 ENCOUNTER — Ambulatory Visit (HOSPITAL_COMMUNITY): Payer: BLUE CROSS/BLUE SHIELD | Admitting: Vascular Surgery

## 2015-12-16 DIAGNOSIS — Z87891 Personal history of nicotine dependence: Secondary | ICD-10-CM | POA: Insufficient documentation

## 2015-12-16 DIAGNOSIS — S83281A Other tear of lateral meniscus, current injury, right knee, initial encounter: Secondary | ICD-10-CM | POA: Diagnosis not present

## 2015-12-16 DIAGNOSIS — X58XXXA Exposure to other specified factors, initial encounter: Secondary | ICD-10-CM | POA: Insufficient documentation

## 2015-12-16 DIAGNOSIS — M1990 Unspecified osteoarthritis, unspecified site: Secondary | ICD-10-CM | POA: Insufficient documentation

## 2015-12-16 DIAGNOSIS — G4733 Obstructive sleep apnea (adult) (pediatric): Secondary | ICD-10-CM | POA: Insufficient documentation

## 2015-12-16 DIAGNOSIS — I10 Essential (primary) hypertension: Secondary | ICD-10-CM | POA: Diagnosis not present

## 2015-12-16 DIAGNOSIS — Z79899 Other long term (current) drug therapy: Secondary | ICD-10-CM | POA: Insufficient documentation

## 2015-12-16 DIAGNOSIS — Z791 Long term (current) use of non-steroidal anti-inflammatories (NSAID): Secondary | ICD-10-CM | POA: Diagnosis not present

## 2015-12-16 DIAGNOSIS — M2241 Chondromalacia patellae, right knee: Secondary | ICD-10-CM | POA: Diagnosis not present

## 2015-12-16 HISTORY — PX: KNEE ARTHROSCOPY: SHX127

## 2015-12-16 SURGERY — ARTHROSCOPY, KNEE
Anesthesia: General | Site: Knee | Laterality: Right

## 2015-12-16 MED ORDER — PHENYLEPHRINE 40 MCG/ML (10ML) SYRINGE FOR IV PUSH (FOR BLOOD PRESSURE SUPPORT)
PREFILLED_SYRINGE | INTRAVENOUS | Status: AC
  Filled 2015-12-16: qty 10

## 2015-12-16 MED ORDER — PROPOFOL 10 MG/ML IV BOLUS
INTRAVENOUS | Status: DC | PRN
  Administered 2015-12-16: 200 mg via INTRAVENOUS

## 2015-12-16 MED ORDER — LACTATED RINGERS IV SOLN
INTRAVENOUS | Status: DC | PRN
  Administered 2015-12-16: 13:00:00 via INTRAVENOUS

## 2015-12-16 MED ORDER — LIDOCAINE HCL (CARDIAC) 20 MG/ML IV SOLN
INTRAVENOUS | Status: AC
  Filled 2015-12-16: qty 5

## 2015-12-16 MED ORDER — PROMETHAZINE HCL 25 MG/ML IJ SOLN: 6.2500 mg | INTRAMUSCULAR | Status: DC | PRN

## 2015-12-16 MED ORDER — LIDOCAINE HCL (CARDIAC) 20 MG/ML IV SOLN
INTRAVENOUS | Status: DC | PRN
  Administered 2015-12-16: 60 mg via INTRAVENOUS

## 2015-12-16 MED ORDER — MIDAZOLAM HCL 2 MG/2ML IJ SOLN: 0.5000 mg | Freq: Once | INTRAMUSCULAR | Status: DC | PRN

## 2015-12-16 MED ORDER — SCOPOLAMINE 1 MG/3DAYS TD PT72
MEDICATED_PATCH | TRANSDERMAL | Status: AC
  Filled 2015-12-16: qty 1

## 2015-12-16 MED ORDER — KETOROLAC TROMETHAMINE 30 MG/ML IJ SOLN
INTRAMUSCULAR | Status: DC | PRN
  Administered 2015-12-16: 30 mg via INTRAVENOUS

## 2015-12-16 MED ORDER — FENTANYL CITRATE (PF) 250 MCG/5ML IJ SOLN
INTRAMUSCULAR | Status: AC
  Filled 2015-12-16: qty 5

## 2015-12-16 MED ORDER — BUPIVACAINE-EPINEPHRINE 0.5% -1:200000 IJ SOLN
INTRAMUSCULAR | Status: DC | PRN
  Administered 2015-12-16: 30 mL

## 2015-12-16 MED ORDER — PROPOFOL 10 MG/ML IV BOLUS
INTRAVENOUS | Status: AC
  Filled 2015-12-16: qty 20

## 2015-12-16 MED ORDER — MEPERIDINE HCL 25 MG/ML IJ SOLN: 6.2500 mg | INTRAMUSCULAR | Status: DC | PRN

## 2015-12-16 MED ORDER — LACTATED RINGERS IV SOLN
INTRAVENOUS | Status: DC
  Administered 2015-12-16: 12:00:00 via INTRAVENOUS

## 2015-12-16 MED ORDER — SUCCINYLCHOLINE CHLORIDE 20 MG/ML IJ SOLN
INTRAMUSCULAR | Status: DC | PRN
  Administered 2015-12-16: 140 mg via INTRAVENOUS

## 2015-12-16 MED ORDER — FENTANYL CITRATE (PF) 100 MCG/2ML IJ SOLN
INTRAMUSCULAR | Status: DC | PRN
  Administered 2015-12-16: 150 ug via INTRAVENOUS

## 2015-12-16 MED ORDER — ROCURONIUM BROMIDE 50 MG/5ML IV SOLN
INTRAVENOUS | Status: AC
  Filled 2015-12-16: qty 1

## 2015-12-16 MED ORDER — HYDROMORPHONE HCL 1 MG/ML IJ SOLN: 0.2500 mg | INTRAMUSCULAR | Status: DC | PRN

## 2015-12-16 MED ORDER — SCOPOLAMINE 1 MG/3DAYS TD PT72
1.0000 | MEDICATED_PATCH | Freq: Once | TRANSDERMAL | Status: AC
  Administered 2015-12-16: 1 via TRANSDERMAL

## 2015-12-16 MED ORDER — SODIUM CHLORIDE 0.9 % IR SOLN
Status: DC | PRN
  Administered 2015-12-16: 3000 mL

## 2015-12-16 MED ORDER — MIDAZOLAM HCL 5 MG/5ML IJ SOLN
INTRAMUSCULAR | Status: DC | PRN
  Administered 2015-12-16: 2 mg via INTRAVENOUS

## 2015-12-16 MED ORDER — BUPIVACAINE-EPINEPHRINE (PF) 0.25% -1:200000 IJ SOLN
INTRAMUSCULAR | Status: AC
  Filled 2015-12-16: qty 30

## 2015-12-16 MED ORDER — OXYCODONE-ACETAMINOPHEN 5-325 MG PO TABS: 1.0000 | ORAL_TABLET | ORAL | Status: DC | PRN

## 2015-12-16 MED ORDER — ONDANSETRON HCL 4 MG/2ML IJ SOLN
INTRAMUSCULAR | Status: AC
  Filled 2015-12-16: qty 2

## 2015-12-16 MED ORDER — ONDANSETRON HCL 4 MG/2ML IJ SOLN
INTRAMUSCULAR | Status: DC | PRN
  Administered 2015-12-16: 4 mg via INTRAVENOUS

## 2015-12-16 MED ORDER — METHOCARBAMOL 500 MG PO TABS: 500.0000 mg | ORAL_TABLET | Freq: Three times a day (TID) | ORAL | Status: DC | PRN

## 2015-12-16 MED ORDER — MIDAZOLAM HCL 2 MG/2ML IJ SOLN
INTRAMUSCULAR | Status: AC
  Filled 2015-12-16: qty 2

## 2015-12-16 SURGICAL SUPPLY — 35 items
BANDAGE ELASTIC 6 VELCRO ST LF (GAUZE/BANDAGES/DRESSINGS) ×2 IMPLANT
BLADE CUTTER GATOR 3.5 (BLADE) ×2 IMPLANT
BNDG COHESIVE 6X5 TAN STRL LF (GAUZE/BANDAGES/DRESSINGS) IMPLANT
BNDG ELASTIC 6X10 VLCR STRL LF (GAUZE/BANDAGES/DRESSINGS) ×2 IMPLANT
BNDG GAUZE ELAST 4 BULKY (GAUZE/BANDAGES/DRESSINGS) ×2 IMPLANT
CLSR STERI-STRIP ANTIMIC 1/2X4 (GAUZE/BANDAGES/DRESSINGS) ×2 IMPLANT
DRAPE ARTHROSCOPY W/POUCH 114 (DRAPES) ×2 IMPLANT
DRSG PAD ABDOMINAL 8X10 ST (GAUZE/BANDAGES/DRESSINGS) ×2 IMPLANT
DURAPREP 26ML APPLICATOR (WOUND CARE) ×2 IMPLANT
ELECT MENISCUS 165MM 90D (ELECTRODE) IMPLANT
ELECT REM PT RETURN 9FT ADLT (ELECTROSURGICAL)
ELECTRODE REM PT RTRN 9FT ADLT (ELECTROSURGICAL) IMPLANT
GAUZE SPONGE 4X4 12PLY STRL (GAUZE/BANDAGES/DRESSINGS) IMPLANT
GAUZE SPONGE 4X4 16PLY XRAY LF (GAUZE/BANDAGES/DRESSINGS) IMPLANT
GLOVE BIOGEL PI ORTHO PRO SZ8 (GLOVE) ×1
GLOVE PI ORTHO PRO STRL SZ8 (GLOVE) ×1 IMPLANT
GLOVE SURG ORTHO 8.5 STRL (GLOVE) ×2 IMPLANT
GOWN STRL REUS W/ TWL XL LVL3 (GOWN DISPOSABLE) ×2 IMPLANT
GOWN STRL REUS W/TWL XL LVL3 (GOWN DISPOSABLE) ×2
KIT BASIN OR (CUSTOM PROCEDURE TRAY) ×2 IMPLANT
KIT ROOM TURNOVER OR (KITS) ×2 IMPLANT
MANIFOLD NEPTUNE II (INSTRUMENTS) ×2 IMPLANT
PACK ARTHROSCOPY DSU (CUSTOM PROCEDURE TRAY) ×2 IMPLANT
PAD ARMBOARD 7.5X6 YLW CONV (MISCELLANEOUS) ×4 IMPLANT
PENCIL BUTTON HOLSTER BLD 10FT (ELECTRODE) IMPLANT
SET ARTHROSCOPY TUBING (MISCELLANEOUS) ×1
SET ARTHROSCOPY TUBING LN (MISCELLANEOUS) ×1 IMPLANT
SPONGE GAUZE 4X4 12PLY STER LF (GAUZE/BANDAGES/DRESSINGS) ×2 IMPLANT
STRIP CLOSURE SKIN 1/2X4 (GAUZE/BANDAGES/DRESSINGS) IMPLANT
SUT MNCRL AB 4-0 PS2 18 (SUTURE) ×2 IMPLANT
TOWEL OR 17X24 6PK STRL BLUE (TOWEL DISPOSABLE) ×4 IMPLANT
TUBE CONNECTING 12X1/4 (SUCTIONS) ×2 IMPLANT
WAND HAND CNTRL MULTIVAC 90 (MISCELLANEOUS) IMPLANT
WATER STERILE IRR 1000ML POUR (IV SOLUTION) ×2 IMPLANT
WRAP KNEE MAXI GEL POST OP (GAUZE/BANDAGES/DRESSINGS) ×2 IMPLANT

## 2015-12-16 NOTE — Discharge Instructions (Signed)
Ice and elevate the knee when possible.  Use a walker and place MINIMAL weight on the right knee over the next week but remain mobile and change positions frequently in the home to decrease blood clot risk.  Keep the stocking on the left leg at all times to prevent blood clots.  Change bandage to Band Aids over the Steri Strips on Monday and apply the right knee high stocking over the BAnd Aids.  Ok to get wet in the shower in 5 days (next Wednesday)  Do exercises every hour while awake for 5 minutes - Calf Pumps, Heel Slides,  Straight Leg Raises  Take a 325mg  Aspirin regular adult dose twice per day with food for the next month to thin the blood and decrease blood clot risk.   If you have SHORTNESS OF BREATH or CHEST PAIN, or A RAPID HEART RATE, or a BLOODY COUGH you must dial 911 and go to the hospital to be evaluated  Follow up in two weeks  [phone]

## 2015-12-16 NOTE — Anesthesia Postprocedure Evaluation (Signed)
Anesthesia Post Note  Patient: Agapito GamesEdward Lares  Procedure(s) Performed: Procedure(s) (LRB): RIGHT KNEE ARTHROSCOPY , Partial Lateral Meniscectomy, Partial Chondroplasty, Lateral femoral Chondroplasty (Right)  Patient location during evaluation: PACU Anesthesia Type: General Level of consciousness: awake and alert Pain management: pain level controlled Vital Signs Assessment: post-procedure vital signs reviewed and stable Respiratory status: spontaneous breathing, nonlabored ventilation, respiratory function stable and patient connected to nasal cannula oxygen Cardiovascular status: blood pressure returned to baseline and stable Postop Assessment: no signs of nausea or vomiting Anesthetic complications: no    Last Vitals:  Filed Vitals:   12/16/15 1507 12/16/15 1540  BP: 133/74 126/82  Pulse: 99 95  Temp:    Resp: 17 16    Last Pain:  Filed Vitals:   12/16/15 1552  PainSc: 0-No pain                 Duane Trias,W. EDMOND

## 2015-12-16 NOTE — Anesthesia Procedure Notes (Signed)
Procedure Name: Intubation Date/Time: 12/16/2015 1:44 PM Performed by: Rogelia BogaMUELLER, Chonte Ricke P Pre-anesthesia Checklist: Patient identified, Emergency Drugs available, Suction available, Patient being monitored and Timeout performed Patient Re-evaluated:Patient Re-evaluated prior to inductionOxygen Delivery Method: Circle system utilized Preoxygenation: Pre-oxygenation with 100% oxygen Intubation Type: IV induction, Cricoid Pressure applied and Rapid sequence Laryngoscope Size: Mac and 4 Grade View: Grade II Tube type: Oral Tube size: 7.0 mm Number of attempts: 1 Airway Equipment and Method: Stylet Placement Confirmation: ETT inserted through vocal cords under direct vision,  positive ETCO2 and breath sounds checked- equal and bilateral Secured at: 26 cm Tube secured with: Tape Dental Injury: Teeth and Oropharynx as per pre-operative assessment

## 2015-12-16 NOTE — Anesthesia Preprocedure Evaluation (Addendum)
Anesthesia Evaluation  Patient identified by MRN, date of birth, ID band Patient awake    Reviewed: Allergy & Precautions, NPO status , Patient's Chart, lab work & pertinent test results  History of Anesthesia Complications (+) PONV and history of anesthetic complications  Airway Mallampati: I  TM Distance: >3 FB Neck ROM: Full    Dental  (+) Dental Advisory Given, Chipped   Pulmonary sleep apnea , Current Smoker,    breath sounds clear to auscultation       Cardiovascular hypertension (has not been on meds for over a month),  Rhythm:Regular Rate:Normal     Neuro/Psych  Headaches,    GI/Hepatic Neg liver ROS, GERD  Poorly Controlled,  Endo/Other  Morbid obesity  Renal/GU negative Renal ROS     Musculoskeletal  (+) Arthritis , Osteoarthritis,    Abdominal (+) + obese,   Peds  Hematology   Anesthesia Other Findings   Reproductive/Obstetrics                          Anesthesia Physical Anesthesia Plan  ASA: III  Anesthesia Plan: General   Post-op Pain Management:    Induction: Intravenous  Airway Management Planned: Oral ETT  Additional Equipment:   Intra-op Plan:   Post-operative Plan: Extubation in OR  Informed Consent: I have reviewed the patients History and Physical, chart, labs and discussed the procedure including the risks, benefits and alternatives for the proposed anesthesia with the patient or authorized representative who has indicated his/her understanding and acceptance.   Dental advisory given  Plan Discussed with: CRNA, Surgeon and Anesthesiologist  Anesthesia Plan Comments: (Plan routine monitors, GETA)       Anesthesia Quick Evaluation

## 2015-12-16 NOTE — Transfer of Care (Signed)
Immediate Anesthesia Transfer of Care Note  Patient: Agapito GamesEdward Angerer  Procedure(s) Performed: Procedure(s): RIGHT KNEE ARTHROSCOPY , Partial Lateral Meniscectomy, Partial Chondroplasty, Lateral femoral Chondroplasty (Right)  Patient Location: PACU  Anesthesia Type:General  Level of Consciousness: awake, alert , oriented and patient cooperative  Airway & Oxygen Therapy: Patient Spontanous Breathing and Patient connected to nasal cannula oxygen  Post-op Assessment: Report given to RN, Post -op Vital signs reviewed and stable and Patient moving all extremities X 4  Post vital signs: Reviewed and stable  Last Vitals:  Filed Vitals:   12/16/15 1124  BP: 135/71  Pulse: 95  Temp: 36.5 C  Resp: 20    Complications: No apparent anesthesia complications

## 2015-12-16 NOTE — Op Note (Signed)
[name], [name]             ACCOUNT NO.:  [id number]  MEDICAL RECORD NO.:  [id number]  LOCATION:  [facility]                         FACILITY:  [facility]  PHYSICIAN:  Almedia Balls. Ranell Patrick, M.D. DATE OF BIRTH:  [birth year]  DATE OF PROCEDURE:  12/16/2015 DATE OF DISCHARGE:  12/16/2015                              OPERATIVE REPORT   PREOPERATIVE DIAGNOSIS:  Right knee lateral meniscus tear.  POSTOPERATIVE DIAGNOSIS: 1. Right knee lateral meniscus tear. 2. Lateral femoral condyle, grade III and IV chondromalacia/chondral     fracture and minimal patellar chondromalacia.  PROCEDURE PERFORMED:  Right knee arthroscopy with partial lateral meniscectomy and chondroplasty, not bleeding bone.  Lateral femoral condyle and minimal onto the patellofemoral joint.  ATTENDING SURGEON:  Almedia Balls. Ranell Patrick, M.D.  ASSISTANT:  None.  ANESTHESIA:  General endotracheal anesthesia was used.  ESTIMATED BLOOD LOSS:  Minimal.  FLUID REPLACEMENT:  1200 mL crystalloids.  INSTRUMENT COUNTS:  Correct.  COMPLICATIONS:  There were no complications.  ANTIBIOTICS:  Perioperative antibiotics were given.  INDICATIONS:  The patient is a 30 year old male, with worsening right lateral knee pain.  The patient has had progressive pain despite conservative management.  MRI scan indicating torn lateral meniscus. The patient presents with refractory knee pain desiring a knee surgery. Risks and benefits of surgery were discussed the patient.  Informed consent obtained.  DESCRIPTION OF PROCEDURE:  After an adequate anesthesia achieved, the patient was positioned supine heparin table.  Right lateral post was utilized.  A sterile prep and drape of the right knee and leg performed. The patient is significantly overweight and has an 182 T low weight and it was difficult with positioning, but we were able to avoid call a time- out after sterile prep and drape.  We then went ahead and initiated surgery, after draping and time  out, utilizing standard portals, including superolateral outflow, anterolateral scope, and anteromedial working portals.  Upon significant patellofemoral chondromalacia, grade II, and a little bit of grade III general consent.  Tangential chondroplasty performed.  Medial and lateral gutters inspected and no loose bodies encountered lateral meniscus.  The lateral compartment was entered.  Significant lateral meniscus tear identified.  We performed a partial lateral meniscectomy removing about 50% of the posterolateral horn of the meniscus.  Popliteus tendon was intact.  There was also significant tearing of the cartilage, articular cartilage on the femur. There was actually a focal grade IV cartilage lesion that was appeared to be a chondral fracture and actually even a little bit of the bone crater or bone defect there.  Margins were significantly irregular.  We performed just a tangential chondroplasty, smoothing down transitions between the more normal cartilage around the edge of this lesion, which may be measured 7 x 10 mm, had that smooth down, we did not do any type of marrow stimulation procedure to this area, given the patient's significant weight and essentially the inability to do any type of a modified weightbearing to this and fairly small size relative to the size of the femoral condyle.  We felt like this would respond well to just simple debridement as well as the meniscectomy.  The ACL was intact.  PCL intact with some marginal osteophytes  beginning around the notch, the medial side, then he looked normal.  No signs of meniscal tear.  No signs of articular cartilage problems, having completed chondroplasty primarily lateral compartment partial meniscectomy, we concluded surgery, suturing the wounds a 4 Monocryl followed by Steri- Strips and sterile compressive bandage.  The patient tolerated the surgery well.     Almedia BallsSteven R. Ranell PatrickNorris, M.D.     SRN/MEDQ  D:  12/16/2015   T:  12/16/2015  Job:  [id number]

## 2015-12-16 NOTE — Interval H&P Note (Signed)
History and Physical Interval Note:  12/16/2015 1:07 PM  [name] [name]  has presented today for surgery, with the diagnosis of RIGHT KNEE LATERAL MENSICAL TEAR   The various methods of treatment have been discussed with the patient and family. After consideration of risks, benefits and other options for treatment, the patient has consented to  Procedure(s): RIGHT KNEE ARTHROSCOPY  (Right) as a surgical intervention .  The patient's history has been reviewed, patient examined, no change in status, stable for surgery.  I have reviewed the patient's chart and labs.  Questions were answered to the patient's satisfaction.     Kassidee Narciso,STEVEN R

## 2015-12-16 NOTE — Progress Notes (Signed)
Pt has not been taking home dose of Propanolol, reports hasn't had med in over a month.  It was originally prescribed for his headaches, but turned into a dual purpose med to aid with hypertension.    Called Dr. Sandford Craze. [place] and made aware.  Pt does not need propanolol prior to surgery since he has not been taking it chronically.

## 2015-12-16 NOTE — Interval H&P Note (Signed)
History and Physical Interval Note:  12/16/2015 1:06 PM  [name] GamesEdward [name]  has presented today for surgery, with the diagnosis of RIGHT KNEE LATERAL MENSICAL TEAR   The various methods of treatment have been discussed with the patient and family. After consideration of risks, benefits and other options for treatment, the patient has consented to  Procedure(s): RIGHT KNEE ARTHROSCOPY  (Right) as a surgical intervention .  The patient's history has been reviewed, patient examined, no change in status, stable for surgery.  I have reviewed the patient's chart and labs.  Questions were answered to the patient's satisfaction.     Dazja Houchin,STEVEN R

## 2015-12-16 NOTE — Brief Op Note (Signed)
12/16/2015  3:02 PM  PATIENT:  [name] [name]  30 y.o. male  PRE-OPERATIVE DIAGNOSIS:  RIGHT KNEE LATERAL MENSICAL TEAR   POST-OPERATIVE DIAGNOSIS:  RIGHT KNEE LATERAL MENSICAL TEAR   PROCEDURE:  Procedure(s): RIGHT KNEE ARTHROSCOPY , Partial Lateral Meniscectomy, Partial Chondroplasty, Lateral femoral Chondroplasty (Right)  SURGEON:  Surgeon(s) and Role:    * Beverely LowSteve Rondel Episcopo, MD - Primary  PHYSICIAN ASSISTANT:   ASSISTANTS: none   ANESTHESIA:   general  EBL:  Total I/O In: 600 [I.V.:600] Out: -   BLOOD ADMINISTERED:none  DRAINS: none   LOCAL MEDICATIONS USED:  MARCAINE     SPECIMEN:  No Specimen  DISPOSITION OF SPECIMEN:  N/A  COUNTS:  YES  TOURNIQUET:  * No tourniquets in log *  DICTATION: .Other Dictation: Dictation Number [id number]  PLAN OF CARE: Discharge to home after PACU  PATIENT DISPOSITION:  PACU - hemodynamically stable.   Delay start of Pharmacological VTE agent (>24hrs) due to surgical blood loss or risk of bleeding: no

## 2015-12-19 ENCOUNTER — Encounter (HOSPITAL_COMMUNITY): Payer: Self-pay | Admitting: Orthopedic Surgery

## 2016-02-07 IMAGING — CR DG KNEE COMPLETE 4+V*R*
5 series · 5 of 5 positions shown · non-contrast
Comparison: None.

CLINICAL DATA: Acute right knee pain after fall last night at home.
Initial encounter.

EXAM:
RIGHT KNEE - COMPLETE 4+ VIEW

[t knee ap right]
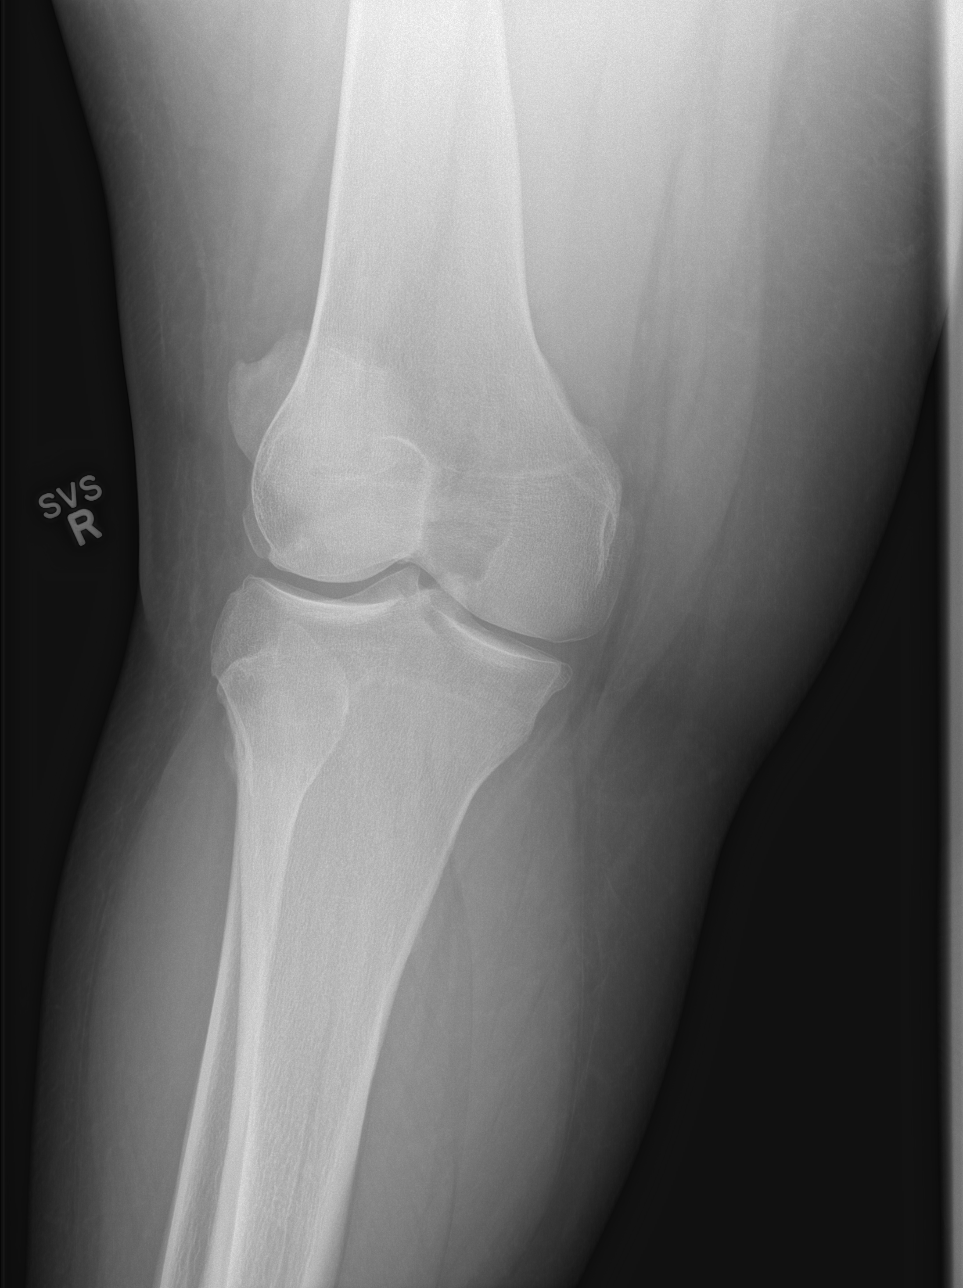

[t knee obl right]
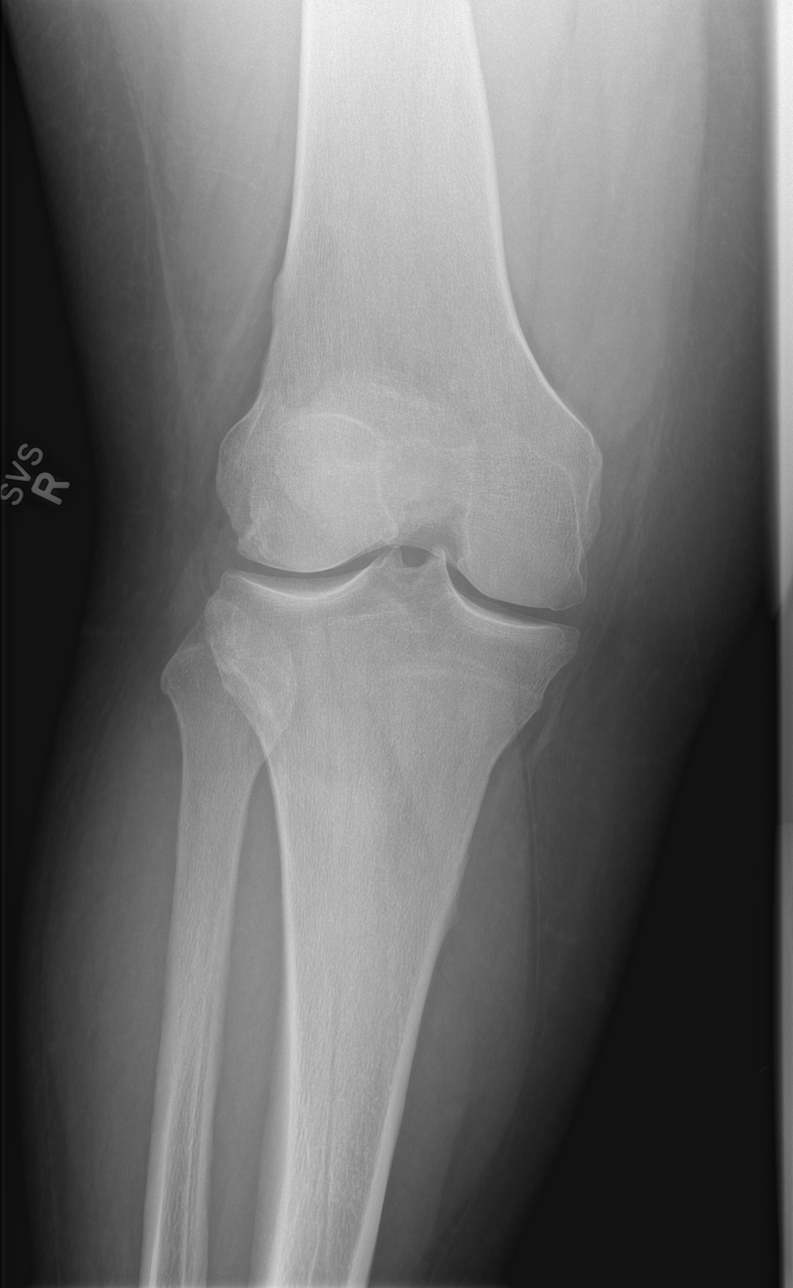

[t knee lat right (1 of 3)]
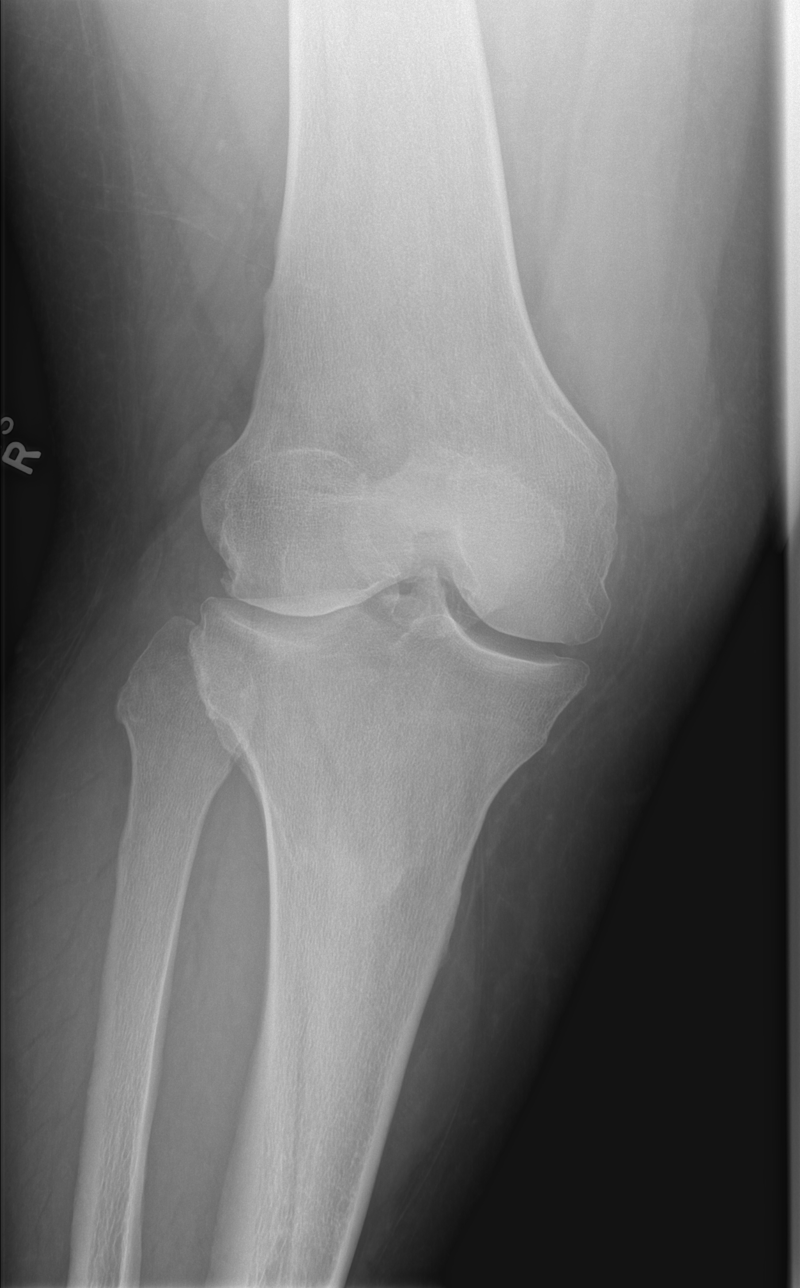

[t knee lat right (2 of 3)]
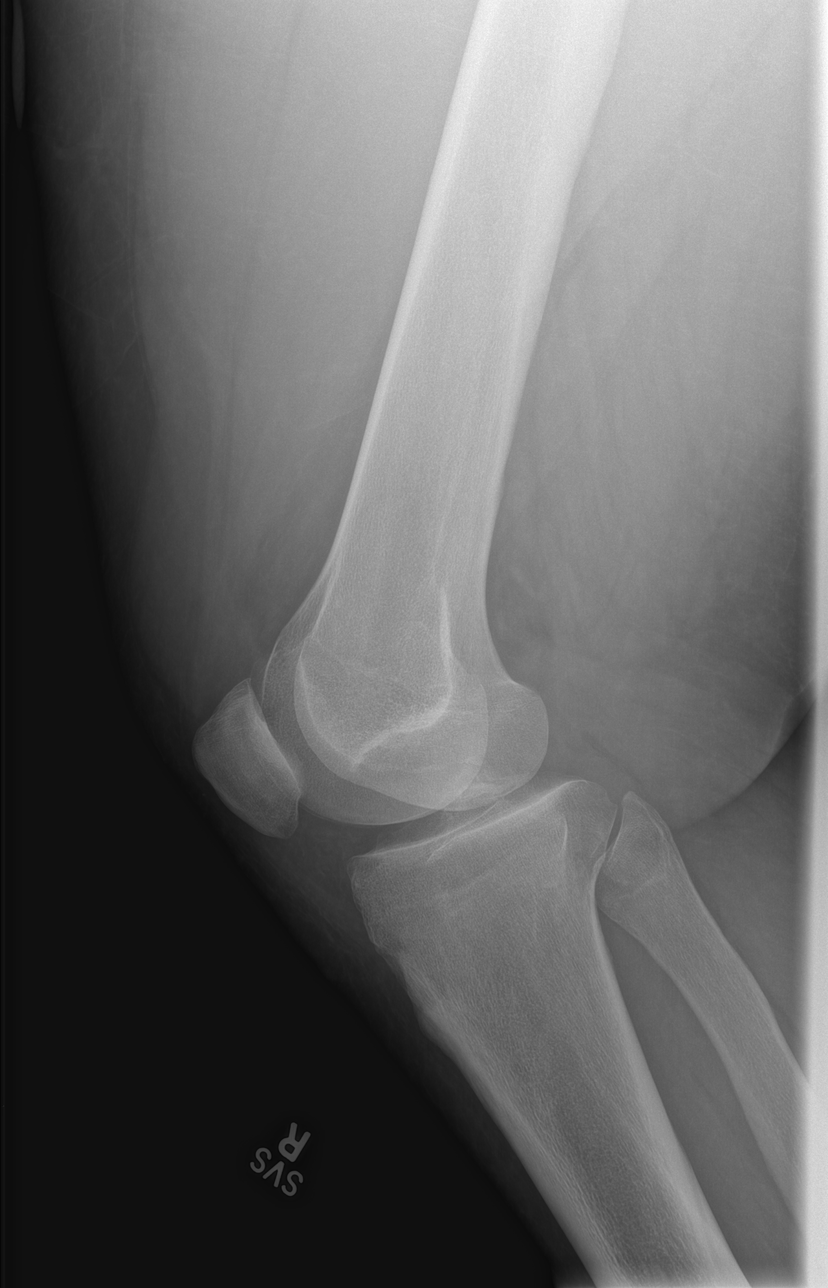

[t knee lat right (3 of 3)]
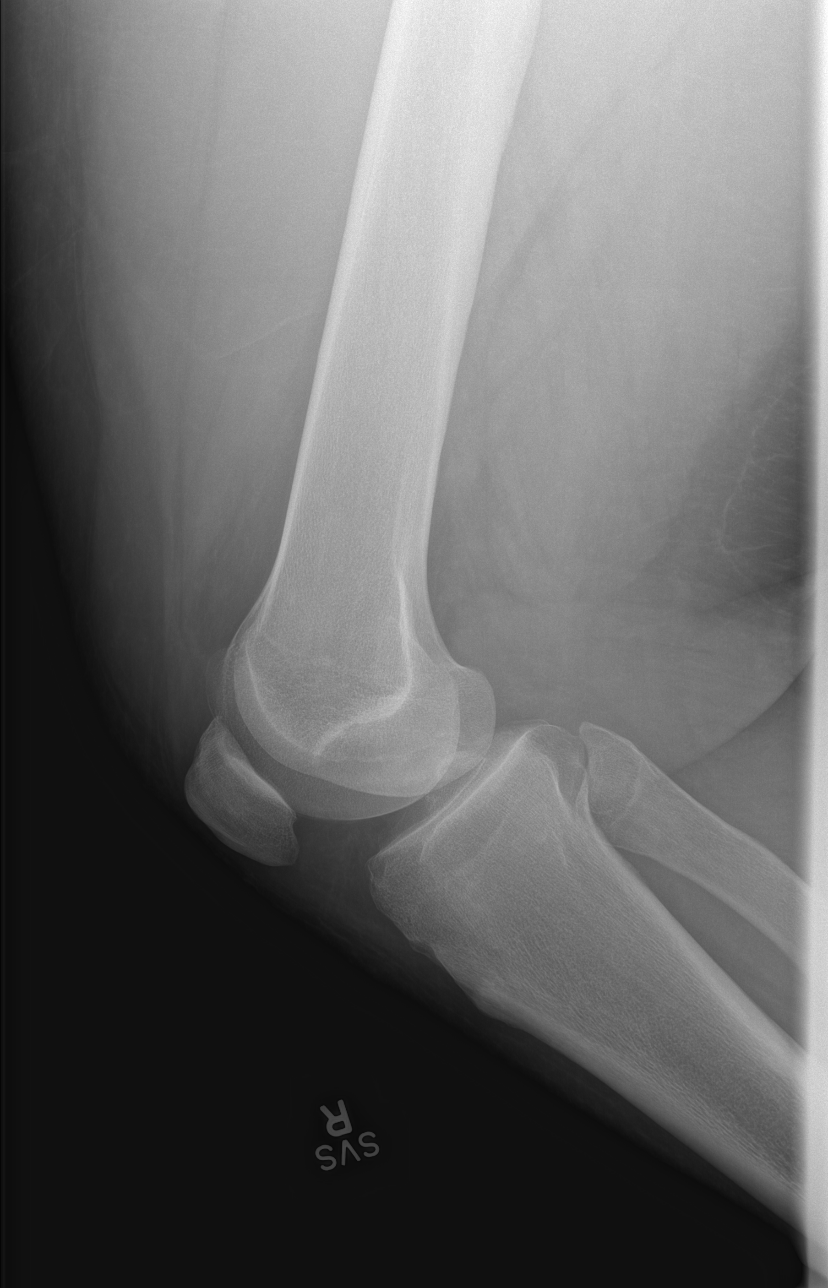

[5 of 5 positions shown; findings below may reference images not displayed]

FINDINGS: There is no evidence of fracture, dislocation, or joint effusion.
There is no evidence of arthropathy or other focal bone abnormality.
Soft tissues are unremarkable.
IMPRESSION: No acute abnormality seen in the right knee.

## 2019-09-30 ENCOUNTER — Other Ambulatory Visit: Payer: Self-pay

## 2019-10-14 ENCOUNTER — Ambulatory Visit: Payer: BLUE CROSS/BLUE SHIELD | Attending: Internal Medicine

## 2019-10-14 DIAGNOSIS — Z20828 Contact with and (suspected) exposure to other viral communicable diseases: Secondary | ICD-10-CM | POA: Insufficient documentation

## 2019-10-14 DIAGNOSIS — Z20822 Contact with and (suspected) exposure to covid-19: Secondary | ICD-10-CM

## 2019-10-15 LAB — NOVEL CORONAVIRUS, NAA: SARS-CoV-2, NAA: NOT DETECTED

## 2019-10-20 ENCOUNTER — Ambulatory Visit: Payer: BLUE CROSS/BLUE SHIELD | Attending: Internal Medicine

## 2019-10-20 DIAGNOSIS — Z20822 Contact with and (suspected) exposure to covid-19: Secondary | ICD-10-CM | POA: Insufficient documentation

## 2019-10-22 LAB — NOVEL CORONAVIRUS, NAA: SARS-CoV-2, NAA: NOT DETECTED

## 2020-03-20 ENCOUNTER — Emergency Department (HOSPITAL_COMMUNITY): Payer: Self-pay

## 2020-03-20 ENCOUNTER — Other Ambulatory Visit: Payer: Self-pay

## 2020-03-20 ENCOUNTER — Emergency Department (HOSPITAL_COMMUNITY)
Admission: EM | Admit: 2020-03-20 | Discharge: 2020-03-20 | Disposition: A | Discharge status: Home / Self Care | Payer: Self-pay | Attending: Emergency Medicine | Admitting: Emergency Medicine

## 2020-03-20 DIAGNOSIS — R05 Cough: Secondary | ICD-10-CM | POA: Insufficient documentation

## 2020-03-20 DIAGNOSIS — Z20822 Contact with and (suspected) exposure to covid-19: Secondary | ICD-10-CM | POA: Insufficient documentation

## 2020-03-20 DIAGNOSIS — R072 Precordial pain: Secondary | ICD-10-CM | POA: Insufficient documentation

## 2020-03-20 DIAGNOSIS — F17210 Nicotine dependence, cigarettes, uncomplicated: Secondary | ICD-10-CM | POA: Insufficient documentation

## 2020-03-20 DIAGNOSIS — R0789 Other chest pain: Secondary | ICD-10-CM

## 2020-03-20 DIAGNOSIS — I10 Essential (primary) hypertension: Secondary | ICD-10-CM | POA: Insufficient documentation

## 2020-03-20 LAB — SARS CORONAVIRUS 2 BY RT PCR (HOSPITAL ORDER, PERFORMED IN ~~LOC~~ HOSPITAL LAB): SARS Coronavirus 2: NEGATIVE

## 2020-03-20 LAB — CBC
HCT: 45.1 % (ref 39.0–52.0)
Hemoglobin: 14.6 g/dL (ref 13.0–17.0)
MCH: 27.7 pg (ref 26.0–34.0)
MCHC: 32.4 g/dL (ref 30.0–36.0)
MCV: 85.4 fL (ref 80.0–100.0)
Platelets: 253 10*3/uL (ref 150–400)
RBC: 5.28 MIL/uL (ref 4.22–5.81)
RDW: 14.1 % (ref 11.5–15.5)
WBC: 12.4 10*3/uL — ABNORMAL HIGH (ref 4.0–10.5)
nRBC: 0 % (ref 0.0–0.2)

## 2020-03-20 LAB — BASIC METABOLIC PANEL
Anion gap: 12 (ref 5–15)
BUN: 10 mg/dL (ref 6–20)
CO2: 25 mmol/L (ref 22–32)
Calcium: 9.2 mg/dL (ref 8.9–10.3)
Chloride: 101 mmol/L (ref 98–111)
Creatinine, Ser: 0.93 mg/dL (ref 0.61–1.24)
GFR calc Af Amer: >60 mL/min (ref >60)
GFR calc non Af Amer: >60 mL/min (ref >60)
Glucose, Bld: 140 mg/dL — ABNORMAL HIGH (ref 70–99)
Potassium: 3.6 mmol/L (ref 3.5–5.1)
Sodium: 138 mmol/L (ref 135–145)

## 2020-03-20 LAB — TROPONIN I (HIGH SENSITIVITY)
Troponin I (High Sensitivity): 4 ng/L (ref <18)
Troponin I (High Sensitivity): 4 ng/L (ref <18)

## 2020-03-20 MED ORDER — SODIUM CHLORIDE 0.9% FLUSH: 3.0000 mL | Freq: Once | INTRAVENOUS | Status: DC

## 2020-03-20 NOTE — ED Provider Notes (Signed)
[facility] EMERGENCY DEPARTMENT Provider Note   CSN: [id number] Arrival date & time: 03/20/20  0533     History Chief Complaint  Patient presents with  . Chest Pain    [name] [name]. is a 34 y.o. male.  The history is provided by the patient.  Chest Pain Pain location:  Substernal area Pain quality: pressure (congestiong)   Pain radiates to:  Does not radiate Pain severity:  Mild Onset quality:  Gradual Duration:  3 hours Progression:  Resolved Chronicity:  New Context: at rest   Worsened by:  Nothing Ineffective treatments:  None tried Associated symptoms: cough   Associated symptoms: no abdominal pain, no back pain, no fever, no palpitations, no shortness of breath and no vomiting   Risk factors: hypertension and smoking   Risk factors: no coronary artery disease, no high cholesterol and no prior DVT/PE        Past Medical History:  Diagnosis Date  . Arthritis   . Asthma    hx  . Bleeding disorder ([place])    seen in ED for hemoptysis 05/05/14, 06/28/06 and for epistaxis 03/31/13. CXR and CBC and/or PT/PTT WNL at those times.   . Chicken pox   . Complication of anesthesia   . Generalized headaches   . Genital warts   . Headache(784.0)   . Hypertension   . Obesity   . OSA (obstructive sleep apnea) 11/11/2014   does not have cpap yet  . PONV (postoperative nausea and vomiting)     Patient Active Problem List   Diagnosis Date Noted  . Rash and nonspecific skin eruption 11/02/2015  . Essential hypertension 05/26/2015  . Routine general medical examination at a health care facility 11/26/2014  . OSA (obstructive sleep apnea) 11/11/2014  . Morbid obesity ([place]) 10/28/2014  . Migraine 10/28/2014  . Chest pain 10/28/2014    Past Surgical History:  Procedure Laterality Date  . ARTHROSCOPIC REPAIR ACL    . KNEE ARTHROSCOPY Right   . KNEE ARTHROSCOPY Right 12/16/2015   Procedure: RIGHT KNEE ARTHROSCOPY , Partial Lateral Meniscectomy,  Partial Chondroplasty, Lateral femoral Chondroplasty;  Surgeon: Netta Cedars, MD;  Location: [place] [place];  Service: Orthopedics;  Laterality: Right;  . KNEE SURGERY Left 2004   repairlacl, mcl,pcl       Family History  Problem Relation Age of Onset  . Hypertension Mother   . Arthritis Mother   . Heart disease Mother   . Drug abuse Mother        cocaine  . Diabetes Father   . Heart disease Father   . Drug abuse Father        cocaine  . Heart disease Maternal Grandmother   . Diabetes Paternal Grandmother   . Diabetes Paternal Grandfather   . Migraines Daughter   . Healthy Brother     Social History   Tobacco Use  . Smoking status: Current Every Day Smoker    Packs/day: 1.00    Years: 15.00    Pack years: 15.00    Types: Cigarettes  . Smokeless tobacco: Never Used  Substance Use Topics  . Alcohol use: Yes    Alcohol/week: 0.0 standard drinks    Comment: Rare  . Drug use: No    Home Medications Prior to Admission medications   Medication Sig Start Date End Date Taking? Authorizing Provider  ibuprofen (ADVIL,MOTRIN) 800 MG tablet Take 1 tablet (800 mg total) by mouth 3 (three) times daily. Patient not taking: Reported on 03/20/2020  10/27/15   Joy, Shawn C, PA-C  methocarbamol (ROBAXIN) 500 MG tablet Take 1 tablet (500 mg total) by mouth 3 (three) times daily as needed. Patient not taking: Reported on 03/20/2020 12/16/15   Beverely Low, MD  oxyCODONE-acetaminophen (PERCOCET/ROXICET) 5-325 MG tablet Take 1-2 tablets by mouth every 4 (four) hours as needed for severe pain. Patient not taking: Reported on 03/20/2020 10/27/15   Joy, Hillard Danker, PA-C  oxyCODONE-acetaminophen (ROXICET) 5-325 MG tablet Take 1-2 tablets by mouth every 4 (four) hours as needed for severe pain. Patient not taking: Reported on 03/20/2020 12/16/15   Beverely Low, MD  propranolol ER (INDERAL LA) 80 MG 24 hr capsule Take 1 capsule (80 mg total) by mouth daily. Patient not taking: Reported on 03/20/2020 05/26/15   Veryl Speak, FNP    Allergies    Patient has no known allergies.  Review of Systems   Review of Systems  Constitutional: Negative for chills and fever.  HENT: Negative for ear pain and sore throat.   Eyes: Negative for pain and visual disturbance.  Respiratory: Positive for cough. Negative for shortness of breath.   Cardiovascular: Positive for chest pain. Negative for palpitations.  Gastrointestinal: Negative for abdominal pain and vomiting.  Genitourinary: Negative for dysuria and hematuria.  Musculoskeletal: Negative for arthralgias and back pain.  Skin: Negative for color change and rash.  Neurological: Negative for seizures and syncope.  All other systems reviewed and are negative.   Physical Exam Updated Vital Signs  ED Triage Vitals [03/20/20 0539]  Enc Vitals Group     BP (!) 122/49     Pulse Rate 84     Resp 16     Temp 98.3 F (36.8 C)     Temp Source Oral     SpO2 95 %     Weight      Height      Head Circumference      Peak Flow      Pain Score      Pain Loc      Pain Edu?      Excl. in GC?     Physical Exam Vitals and nursing note reviewed.  Constitutional:      General: He is not in acute distress.    Appearance: He is well-developed. He is not ill-appearing.  HENT:     Head: Normocephalic and atraumatic.  Eyes:     Conjunctiva/sclera: Conjunctivae normal.     Pupils: Pupils are equal, round, and reactive to light.  Cardiovascular:     Rate and Rhythm: Normal rate and regular rhythm.     Pulses:          Radial pulses are 2+ on the right side and 2+ on the left side.     Heart sounds: Normal heart sounds. No murmur.  Pulmonary:     Effort: Pulmonary effort is normal. No respiratory distress.     Breath sounds: Normal breath sounds. No decreased breath sounds, wheezing, rhonchi or rales.  Abdominal:     Palpations: Abdomen is soft.     Tenderness: There is no abdominal tenderness.  Musculoskeletal:        General: Normal range of motion.      Cervical back: Normal range of motion and neck supple.     Right lower leg: No edema.     Left lower leg: No edema.  Skin:    General: Skin is warm and dry.     Capillary Refill: Capillary refill  takes less than 2 seconds.  Neurological:     General: No focal deficit present.     Mental Status: He is alert.     ED Results / Procedures / Treatments   Labs (all labs ordered are listed, but only abnormal results are displayed) Labs Reviewed  BASIC METABOLIC PANEL - Abnormal; Notable for the following components:      Result Value   Glucose, Bld 140 (*)    All other components within normal limits  CBC - Abnormal; Notable for the following components:   WBC 12.4 (*)    All other components within normal limits  SARS CORONAVIRUS 2 BY RT PCR [facility] ORDER, PERFORMED IN [facility] LAB)  TROPONIN I (HIGH SENSITIVITY)  TROPONIN I (HIGH SENSITIVITY)    EKG EKG Interpretation  Date/Time:  Sunday March 20 2020 05:41:50 EDT Ventricular Rate:  91 PR Interval:  184 QRS Duration: 90 QT Interval:  368 QTC Calculation: 452 R Axis:   70 Text Interpretation: Normal sinus rhythm Normal ECG Confirmed by Virgina Norfolk [phone]) on 03/20/2020 6:56:04 AM   Radiology DG Chest 2 View  Result Date: 03/20/2020 CLINICAL DATA:  Chest pain, cough and congestion. EXAM: CHEST - 2 VIEW COMPARISON:  05/05/2014. FINDINGS: The heart size and mediastinal contours are within normal limits. Both lungs are clear. No pleural effusion or pneumothorax. The visualized skeletal structures are unremarkable. IMPRESSION: No active cardiopulmonary disease. Electronically Signed   By: Amie Portland M.D.   On: 03/20/2020 06:48    Procedures Procedures (including critical care time)  Medications Ordered in ED Medications  sodium chloride flush (NS) 0.9 % injection 3 mL (has no administration in time range)    ED Course  I have reviewed the triage vital signs and the nursing notes.  Pertinent labs &  imaging results that were available during my care of the patient were reviewed by me and considered in my medical decision making (see chart for details).    MDM Rules/Calculators/A&P                      [name] Sermon. is a 34 year old male with history of high blood pressure presents the ED with chest pain.  Patient with normal vitals.  No fever.  Patient with pain that started several hours ago.  Associated with cough.  Concerned that he might have coronavirus.  Denies any ongoing chest pain, shortness of breath.  No abdominal pain.  Does not take any medications anymore.  Does not have many cardiac risk factors.  Overall story seems less likely cardiac ill more likely cold or allergy related.  Will check for coronavirus.  Lab work thus far shows normal troponin.  No signs of pneumonia, pneumothorax, pleural effusion on chest x-ray.  No significant anemia, electrolyte abnormality, kidney injury otherwise.  Patient overall with clear breath sounds on exam.  Will get second troponin and check for coronavirus.  Patient is PERC negative and doubt PE.  Patient with delta troponin that is normal.  Doubt ACS.  Covid test is pending.  Discharged in ED in good condition.  Understands isolation.  This chart was dictated using voice recognition software.  Despite best efforts to proofread,  errors can occur which can change the documentation meaning.  [name] Sermon. was evaluated in Emergency Department on 03/20/2020 for the symptoms described in the history of present illness. He was evaluated in the context of the global COVID-19 pandemic, which necessitated consideration that  the patient might be at risk for infection with the SARS-CoV-2 virus that causes COVID-19. Institutional protocols and algorithms that pertain to the evaluation of patients at risk for COVID-19 are in a state of rapid change based on information released by regulatory bodies including the CDC and federal and state  organizations. These policies and algorithms were followed during the patient's care in the ED.    Final Clinical Impression(s) / ED Diagnoses Final diagnoses:  Atypical chest pain    Rx / DC Orders ED Discharge Orders    None       Virgina Norfolk, DO 03/20/20 5027

## 2020-03-20 NOTE — Discharge Instructions (Signed)
Lab work was overall normal today.  No signs to suggest a heart attack.  Likely some cold/viral type symptoms.  Covid test should later today.

## 2020-03-20 NOTE — ED Triage Notes (Signed)
The pt is c/o chest pain cough congestion for 2 hours  The pt has had both pfizer vaccines June 1st  He thinks he might covid again

## 2020-10-09 ENCOUNTER — Emergency Department (HOSPITAL_BASED_OUTPATIENT_CLINIC_OR_DEPARTMENT_OTHER): Payer: BC Managed Care – PPO

## 2020-10-09 ENCOUNTER — Other Ambulatory Visit: Payer: Self-pay

## 2020-10-09 ENCOUNTER — Encounter (HOSPITAL_BASED_OUTPATIENT_CLINIC_OR_DEPARTMENT_OTHER): Payer: Self-pay | Admitting: Emergency Medicine

## 2020-10-09 ENCOUNTER — Emergency Department (HOSPITAL_BASED_OUTPATIENT_CLINIC_OR_DEPARTMENT_OTHER)
Admission: EM | Admit: 2020-10-09 | Discharge: 2020-10-09 | Disposition: A | Discharge status: Home / Self Care | Payer: BC Managed Care – PPO | Attending: Emergency Medicine | Admitting: Emergency Medicine

## 2020-10-09 DIAGNOSIS — M545 Low back pain, unspecified: Secondary | ICD-10-CM | POA: Diagnosis present

## 2020-10-09 DIAGNOSIS — F17210 Nicotine dependence, cigarettes, uncomplicated: Secondary | ICD-10-CM | POA: Insufficient documentation

## 2020-10-09 DIAGNOSIS — J45909 Unspecified asthma, uncomplicated: Secondary | ICD-10-CM | POA: Insufficient documentation

## 2020-10-09 DIAGNOSIS — Z79899 Other long term (current) drug therapy: Secondary | ICD-10-CM | POA: Insufficient documentation

## 2020-10-09 DIAGNOSIS — I10 Essential (primary) hypertension: Secondary | ICD-10-CM | POA: Insufficient documentation

## 2020-10-09 LAB — CBC WITH DIFFERENTIAL/PLATELET
Abs Immature Granulocytes: 0.03 10*3/uL (ref 0.00–0.07)
Basophils Absolute: 0 10*3/uL (ref 0.0–0.1)
Basophils Relative: 0 %
Eosinophils Absolute: 0 10*3/uL (ref 0.0–0.5)
Eosinophils Relative: 0 %
HCT: 49.3 % (ref 39.0–52.0)
Hemoglobin: 16.3 g/dL (ref 13.0–17.0)
Immature Granulocytes: 0 %
Lymphocytes Relative: 17 %
Lymphs Abs: 1.8 10*3/uL (ref 0.7–4.0)
MCH: 27.8 pg (ref 26.0–34.0)
MCHC: 33.1 g/dL (ref 30.0–36.0)
MCV: 84.1 fL (ref 80.0–100.0)
Monocytes Absolute: 0.6 10*3/uL (ref 0.1–1.0)
Monocytes Relative: 6 %
Neutro Abs: 8.1 10*3/uL — ABNORMAL HIGH (ref 1.7–7.7)
Neutrophils Relative %: 77 %
Platelets: 254 10*3/uL (ref 150–400)
RBC: 5.86 MIL/uL — ABNORMAL HIGH (ref 4.22–5.81)
RDW: 13.9 % (ref 11.5–15.5)
WBC: 10.5 10*3/uL (ref 4.0–10.5)
nRBC: 0 % (ref 0.0–0.2)

## 2020-10-09 LAB — URINALYSIS, ROUTINE W REFLEX MICROSCOPIC
Bilirubin Urine: NEGATIVE
Glucose, UA: NEGATIVE mg/dL
Hgb urine dipstick: NEGATIVE
Ketones, ur: NEGATIVE mg/dL
Leukocytes,Ua: NEGATIVE
Nitrite: NEGATIVE
Protein, ur: NEGATIVE mg/dL
Specific Gravity, Urine: 1.025 (ref 1.005–1.030)
pH: 6 (ref 5.0–8.0)

## 2020-10-09 LAB — BASIC METABOLIC PANEL
Anion gap: 10 (ref 5–15)
BUN: 14 mg/dL (ref 6–20)
CO2: 26 mmol/L (ref 22–32)
Calcium: 9.6 mg/dL (ref 8.9–10.3)
Chloride: 99 mmol/L (ref 98–111)
Creatinine, Ser: 0.99 mg/dL (ref 0.61–1.24)
GFR, Estimated: >60 mL/min (ref >60)
Glucose, Bld: 103 mg/dL — ABNORMAL HIGH (ref 70–99)
Potassium: 4 mmol/L (ref 3.5–5.1)
Sodium: 135 mmol/L (ref 135–145)

## 2020-10-09 MED ORDER — NAPROXEN 500 MG PO TABS: 500.0000 mg | ORAL_TABLET | Freq: Two times a day (BID) | ORAL | 0 refills | Status: AC

## 2020-10-09 MED ORDER — CYCLOBENZAPRINE HCL 10 MG PO TABS
10.0000 mg | ORAL_TABLET | Freq: Two times a day (BID) | ORAL | 0 refills | Status: AC | PRN
Start: 2020-10-09 — End: ?

## 2020-10-09 NOTE — ED Triage Notes (Signed)
Pt c/o left sided back pain that radiates down to left testicle area x 1 month. Pt denies injury or urinary issues.

## 2020-10-09 NOTE — ED Notes (Signed)
Patient transported to X-ray 

## 2020-10-09 NOTE — ED Notes (Signed)
Pt pain for one month in left lower back, testicle and buttock.

## 2020-10-09 NOTE — Discharge Instructions (Addendum)
Take the medications as needed for pain.  Follow-up with your primary care doctor to be rechecked if the symptoms persist °

## 2020-10-09 NOTE — ED Provider Notes (Signed)
[facility] EMERGENCY DEPARTMENT Provider Note   CSN: [id number] Arrival date & time: 10/09/20  1056     History Chief Complaint  Patient presents with  . Back Pain    [name] [name]. is a 34 y.o. male.  HPI   Patient presented to the ED for evaluation of back pain.  Patient states the symptoms started about 1 month ago.  He has been having pain in the left lower back that seems to radiate into his testicle.  He is not having any trouble with urination.  Has not noticed any blood in his urination he has not had any testicle swelling or redness.  The pain is constant.  Nothing seems to [place] make it any better.  No fevers or chills.  No vomiting.  No abdominal pain.  He has not seen anyone for it but came today as over the last few days it has been more uncomfortable.  Past Medical History:  Diagnosis Date  . Arthritis   . Asthma    hx  . Bleeding disorder ([facility])    seen in ED for hemoptysis 05/05/14, 06/28/06 and for epistaxis 03/31/13. CXR and CBC and/or PT/PTT WNL at those times.   . Chicken pox   . Complication of anesthesia   . Generalized headaches   . Genital warts   . Headache(784.0)   . Hypertension   . Obesity   . OSA (obstructive sleep apnea) 11/11/2014   does not have cpap yet  . PONV (postoperative nausea and vomiting)     Patient Active Problem List   Diagnosis Date Noted  . Rash and nonspecific skin eruption 11/02/2015  . Essential hypertension 05/26/2015  . Routine general medical examination at a health care facility 11/26/2014  . OSA (obstructive sleep apnea) 11/11/2014  . Morbid obesity ([facility]) 10/28/2014  . Migraine 10/28/2014  . Chest pain 10/28/2014    Past Surgical History:  Procedure Laterality Date  . ARTHROSCOPIC REPAIR ACL    . KNEE ARTHROSCOPY Right   . KNEE ARTHROSCOPY Right 12/16/2015   Procedure: RIGHT KNEE ARTHROSCOPY , Partial Lateral Meniscectomy, Partial Chondroplasty, Lateral femoral Chondroplasty;  Surgeon: Beverely Low, MD;  Location: [facility];  Service: Orthopedics;  Laterality: Right;  . KNEE SURGERY Left 2004   repairlacl, mcl,pcl       Family History  Problem Relation Age of Onset  . Hypertension Mother   . Arthritis Mother   . Heart disease Mother   . Drug abuse Mother        cocaine  . Diabetes Father   . Heart disease Father   . Drug abuse Father        cocaine  . Heart disease Maternal Grandmother   . Diabetes Paternal Grandmother   . Diabetes Paternal Grandfather   . Migraines Daughter   . Healthy Brother     Social History   Tobacco Use  . Smoking status: Current Every Day Smoker    Packs/day: 1.00    Years: 15.00    Pack years: 15.00    Types: Cigarettes  . Smokeless tobacco: Never Used  Vaping Use  . Vaping Use: Never used  Substance Use Topics  . Alcohol use: Yes    Alcohol/week: 0.0 standard drinks    Comment: Rare  . Drug use: No    Home Medications Prior to Admission medications   Medication Sig Start Date End Date Taking? Authorizing Provider  cyclobenzaprine (FLEXERIL) 10 MG tablet Take 1 tablet (10 mg total)  by mouth 2 (two) times daily as needed for muscle spasms. 10/09/20   Linwood Dibbles, MD  naproxen (NAPROSYN) 500 MG tablet Take 1 tablet (500 mg total) by mouth 2 (two) times daily with a meal. As needed for pain 10/09/20   Linwood Dibbles, MD  propranolol ER (INDERAL LA) 80 MG 24 hr capsule Take 1 capsule (80 mg total) by mouth daily. Patient not taking: No sig reported 05/26/15   Veryl Speak, FNP    Allergies    Patient has no known allergies.  Review of Systems   Review of Systems  All other systems reviewed and are negative.   Physical Exam Updated Vital Signs BP (!) 134/100 (BP Location: Right Arm)   Pulse 74   Temp 98.2 F (36.8 C) (Oral)   Resp 16   Ht 1.778 m (5\' 10" )   Wt (!) 172.4 kg   SpO2 99%   BMI 54.52 kg/m   Physical Exam Vitals and nursing note reviewed.  Constitutional:      General: He is not in acute distress.     Appearance: He is well-developed and well-nourished.  HENT:     Head: Normocephalic and atraumatic.     Right Ear: External ear normal.     Left Ear: External ear normal.  Eyes:     General: No scleral icterus.       Right eye: No discharge.        Left eye: No discharge.     Conjunctiva/sclera: Conjunctivae normal.  Neck:     Trachea: No tracheal deviation.  Cardiovascular:     Rate and Rhythm: Normal rate and regular rhythm.     Pulses: Intact distal pulses.  Pulmonary:     Effort: Pulmonary effort is normal. No respiratory distress.     Breath sounds: Normal breath sounds. No stridor. No wheezing or rales.  Abdominal:     General: Bowel sounds are normal. There is no distension.     Palpations: Abdomen is soft.     Tenderness: There is no abdominal tenderness. There is no guarding or rebound.  Genitourinary:    Penis: Normal.      Testes: Normal.     Comments: No testicular swelling, no mass, no inguinal hernia noted Musculoskeletal:        General: Tenderness present. No edema.     Cervical back: Neck supple.     Comments: Tenderness palpation paraspinal lumbar region  Skin:    General: Skin is warm and dry.     Findings: No rash.  Neurological:     Mental Status: He is alert.     Cranial Nerves: No cranial nerve deficit (no facial droop, extraocular movements intact, no slurred speech).     Sensory: No sensory deficit.     Motor: No abnormal muscle tone or seizure activity.     Coordination: Coordination normal.     Deep Tendon Reflexes: Strength normal.  Psychiatric:        Mood and Affect: Mood and affect normal.     ED Results / Procedures / Treatments   Labs (all labs ordered are listed, but only abnormal results are displayed) Labs Reviewed  CBC WITH DIFFERENTIAL/PLATELET - Abnormal; Notable for the following components:      Result Value   RBC 5.86 (*)    Neutro Abs 8.1 (*)    All other components within normal limits  BASIC METABOLIC PANEL - Abnormal;  Notable for the following components:   Glucose, Bld  103 (*)    All other components within normal limits  URINALYSIS, ROUTINE W REFLEX MICROSCOPIC    EKG None  Radiology DG Lumbar Spine Complete  Result Date: 10/09/2020 CLINICAL DATA:  Left-sided back pain for 1 month EXAM: LUMBAR SPINE - COMPLETE 4+ VIEW COMPARISON:  None. FINDINGS: There is no evidence of lumbar spine fracture. Alignment is normal. Intervertebral disc spaces are maintained. IMPRESSION: Negative. Electronically Signed   By: Duanne Guess D.O.   On: 10/09/2020 12:19    Procedures Procedures (including critical care time)  Medications Ordered in ED Medications - No data to display  ED Course  I have reviewed the triage vital signs and the nursing notes.  Pertinent labs & imaging results that were available during my care of the patient were reviewed by me and considered in my medical decision making (see chart for details).    MDM Rules/Calculators/A&P                          Patient complains of low back pain.  On exam he has tenderness palpation in the paraspinal region of his lumbar spine.  No neurovascular symptoms.  Doubt sciatica.  Patient was also complaining of pain rating to the testicular region.  He has no evidence of swelling on exam.  No notes of hernia.  Doubt symptoms are related to orchitis or epididymitis.  Renal colic was a consideration however he does not have any blood in his urine symptoms seem to be more musculoskeletal in nature.  Will discharge home with NSAIDs and muscle relaxant.  Outpatient follow-up with PCP. Final Clinical Impression(s) / ED Diagnoses Final diagnoses:  Acute low back pain without sciatica, unspecified back pain laterality    Rx / DC Orders ED Discharge Orders         Ordered    naproxen (NAPROSYN) 500 MG tablet  2 times daily with meals        10/09/20 1350    cyclobenzaprine (FLEXERIL) 10 MG tablet  2 times daily PRN        10/09/20 1350            Linwood Dibbles, MD 10/09/20 1352

## 2021-09-26 ENCOUNTER — Other Ambulatory Visit: Payer: Self-pay | Admitting: Orthopedic Surgery

## 2021-09-26 DIAGNOSIS — M545 Low back pain, unspecified: Secondary | ICD-10-CM

## 2021-10-13 ENCOUNTER — Other Ambulatory Visit: Payer: Self-pay

## 2021-10-13 ENCOUNTER — Ambulatory Visit
Admission: RE | Admit: 2021-10-13 | Discharge: 2021-10-13 | Disposition: A | Discharge status: Home / Self Care | Payer: Worker's Compensation | Source: Ambulatory Visit | Attending: Orthopedic Surgery | Admitting: Orthopedic Surgery

## 2021-10-13 DIAGNOSIS — M545 Low back pain, unspecified: Secondary | ICD-10-CM

## 2022-01-23 ENCOUNTER — Other Ambulatory Visit: Payer: Self-pay

## 2022-01-23 ENCOUNTER — Emergency Department (HOSPITAL_BASED_OUTPATIENT_CLINIC_OR_DEPARTMENT_OTHER)
Admission: EM | Admit: 2022-01-23 | Discharge: 2022-01-23 | Disposition: A | Discharge status: Home / Self Care | Payer: BC Managed Care – PPO | Attending: Emergency Medicine | Admitting: Emergency Medicine

## 2022-01-23 ENCOUNTER — Encounter (HOSPITAL_BASED_OUTPATIENT_CLINIC_OR_DEPARTMENT_OTHER): Payer: Self-pay

## 2022-01-23 DIAGNOSIS — J039 Acute tonsillitis, unspecified: Secondary | ICD-10-CM

## 2022-01-23 DIAGNOSIS — H66001 Acute suppurative otitis media without spontaneous rupture of ear drum, right ear: Secondary | ICD-10-CM | POA: Diagnosis not present

## 2022-01-23 DIAGNOSIS — H9201 Otalgia, right ear: Secondary | ICD-10-CM | POA: Diagnosis present

## 2022-01-23 LAB — GROUP A STREP BY PCR: Group A Strep by PCR: NOT DETECTED

## 2022-01-23 MED ORDER — DEXAMETHASONE SODIUM PHOSPHATE 10 MG/ML IJ SOLN
10.0000 mg | Freq: Once | INTRAMUSCULAR | Status: AC
  Administered 2022-01-23: 10 mg via INTRAMUSCULAR
  Filled 2022-01-23: qty 1

## 2022-01-23 MED ORDER — AMOXICILLIN 500 MG PO CAPS: 500.0000 mg | ORAL_CAPSULE | Freq: Three times a day (TID) | ORAL | 0 refills | Status: AC

## 2022-01-23 NOTE — ED Provider Notes (Signed)
?[facility] EMERGENCY DEPARTMENT ?Provider Note ? ? ?CSN: [id number] ?Arrival date & time: 01/23/22  1120 ? ?  ? ?History ? ?Chief Complaint  ?Patient presents with  ? Sore Throat  ? ? ?[name] [name]. is a 36 y.o. male. ? ?Patient with no pertinent past medical history presents today with complaints of sore throat and right ear pain.  States that same began on Saturday and has been worsening since.  He denies any fevers or chills.  Endorses ability to swallow but with pain.  Denies any shortness of breath, chest pain, nausea, vomiting, diarrhea.  No known sick contacts.  No meds PTA. ? ? ?Sore Throat ?Pertinent negatives include no chest pain, no headaches and no shortness of breath.  ? ?  ? ?Home Medications ?Prior to Admission medications   ?Medication Sig Start Date End Date Taking? Authorizing Provider  ?cyclobenzaprine (FLEXERIL) 10 MG tablet Take 1 tablet (10 mg total) by mouth 2 (two) times daily as needed for muscle spasms. 10/09/20   Linwood Dibbles, MD  ?naproxen (NAPROSYN) 500 MG tablet Take 1 tablet (500 mg total) by mouth 2 (two) times daily with a meal. As needed for pain 10/09/20   Linwood Dibbles, MD  ?propranolol ER (INDERAL LA) 80 MG 24 hr capsule Take 1 capsule (80 mg total) by mouth daily. ?Patient not taking: No sig reported 05/26/15   Veryl Speak, FNP  ?   ? ?Allergies    ?Gramineae pollens   ? ?Review of Systems   ?Review of Systems  ?Constitutional:  Negative for chills and fever.  ?HENT:  Positive for ear pain and sore throat. Negative for congestion, drooling, ear discharge, facial swelling, hearing loss, mouth sores, trouble swallowing and voice change.   ?Eyes:  Negative for visual disturbance.  ?Respiratory:  Negative for shortness of breath.   ?Cardiovascular:  Negative for chest pain.  ?Gastrointestinal:  Negative for diarrhea, nausea and vomiting.  ?Musculoskeletal:  Negative for neck pain and neck stiffness.  ?Skin:  Negative for rash.  ?Neurological:  Negative for  dizziness, tremors, seizures, syncope, facial asymmetry, speech difficulty, weakness, light-headedness, numbness and headaches.  ?Psychiatric/Behavioral:  Negative for confusion and decreased concentration.   ?All other systems reviewed and are negative. ? ?Physical Exam ?Updated Vital Signs ?BP (!) 142/87 (BP Location: Left Arm)   Pulse 88   Temp 98.1 ?F (36.7 ?C) (Oral)   Resp 20   Ht 5\' 10"  (1.778 m)   Wt (!) 174.6 kg   SpO2 95%   BMI 55.24 kg/m?  ?Physical Exam ?Vitals and nursing note reviewed.  ?Constitutional:   ?   General: He is not in acute distress. ?   Appearance: Normal appearance. He is normal weight. He is not ill-appearing, toxic-appearing or diaphoretic.  ?HENT:  ?   Head: Normocephalic and atraumatic.  ?   Right Ear: Ear canal normal. No drainage, swelling or tenderness. No middle ear effusion. Tympanic membrane is erythematous.  ?   Left Ear: Tympanic membrane and ear canal normal.  ?   Nose: No congestion.  ?   Mouth/Throat:  ?   Mouth: Mucous membranes are moist. No oral lesions.  ?   Pharynx: No oropharyngeal exudate or uvula swelling.  ?   Tonsils: Tonsillar exudate present. No tonsillar abscesses. 2+ on the right. 2+ on the left.  ?Eyes:  ?   Extraocular Movements:  ?   Right eye: Normal extraocular motion.  ?   Left eye: Normal extraocular motion.  ?  Conjunctiva/sclera: Conjunctivae normal.  ?   Pupils: Pupils are equal, round, and reactive to light.  ?Neck:  ?   Comments: No meningismus ?Cardiovascular:  ?   Rate and Rhythm: Normal rate and regular rhythm.  ?   Heart sounds: Normal heart sounds.  ?Pulmonary:  ?   Effort: Pulmonary effort is normal. No respiratory distress.  ?   Breath sounds: Normal breath sounds.  ?Abdominal:  ?   Palpations: Abdomen is soft.  ?Musculoskeletal:     ?   General: Normal range of motion.  ?   Cervical back: Normal range of motion and neck supple.  ?Lymphadenopathy:  ?   Cervical: No cervical adenopathy.  ?Skin: ?   General: Skin is warm and dry.   ?Neurological:  ?   General: No focal deficit present.  ?   Mental Status: He is alert.  ?Psychiatric:     ?   Mood and Affect: Mood normal.     ?   Behavior: Behavior normal.  ? ? ?ED Results / Procedures / Treatments   ?Labs ?(all labs ordered are listed, but only abnormal results are displayed) ?Labs Reviewed  ?GROUP A STREP BY PCR  ? ? ?EKG ?None ? ?Radiology ?No results found. ? ?Procedures ?Procedures  ? ? ?Medications Ordered in ED ?Medications  ?dexamethasone (DECADRON) injection 10 mg (has no administration in time range)  ? ? ?ED Course/ Medical Decision Making/ A&P ?  ?                        ?Medical Decision Making ?Risk ?Prescription drug management. ? ? ?Patient presents with right-sided otalgia and exam consistent with acute otitis media. No concern for acute mastoiditis, meningitis.  No antibiotic use in the last month.  Patient will be discharged home with Amoxicillin.   ? ?Patient also endorses sore throat with negative strep swab and swollen tonsils with exudate bilaterally.  Treated in ED with steroids with improvement.  He is afebrile, nontoxic-appearing, and in no acute distress with reassuring vital signs.  Pt appears mildly dehydrated, discussed importance of water rehydration. Presentation non concerning for PTA or RPA. No trismus or uvula deviation. Specific return precautions discussed. Pt able to drink water in ED without difficulty with intact air way. Recommended PCP follow up.  Also educated on red flag symptoms of prompt immediate return.  Discharged stable condition. ? ? ?Final Clinical Impression(s) / ED Diagnoses ?Final diagnoses:  ?Non-recurrent acute suppurative otitis media of right ear without spontaneous rupture of tympanic membrane  ?Tonsillitis  ? ? ?Rx / DC Orders ?ED Discharge Orders   ? ?      Ordered  ?  amoxicillin (AMOXIL) 500 MG capsule  3 times daily       ? 01/23/22 1423  ? ?  ?  ? ?  ?An After Visit Summary was printed and given to the patient. ? ? ?  ?Silva Bandy, PA-C ?01/23/22 1424 ? ?  ?Rozelle Logan, DO ?01/23/22 1548 ? ?

## 2022-01-23 NOTE — Discharge Instructions (Signed)
As we discussed, it appears that you have an ear infection on the right side.  I have given you a prescription for antibiotics for management of this.  Please take these as prescribed.  I have also given you steroids in the ER today for management of your tonsillitis.  This should give you improvement of your sore throat.  I also recommend Cepacol throat lozenges for additional support and management of your sore throat.  You may also take Tylenol/ibuprofen as needed for pain. ? ?Return if development of any new or worsening symptoms. ?

## 2022-01-23 NOTE — ED Triage Notes (Signed)
C/o sore throat and right ear pain since Saturday. Difficulty swallowing. ?

## 2022-04-20 ENCOUNTER — Emergency Department (HOSPITAL_BASED_OUTPATIENT_CLINIC_OR_DEPARTMENT_OTHER)
Admission: EM | Admit: 2022-04-20 | Discharge: 2022-04-20 | Disposition: A | Discharge status: Home / Self Care | Payer: BC Managed Care – PPO | Attending: Emergency Medicine | Admitting: Emergency Medicine

## 2022-04-20 ENCOUNTER — Other Ambulatory Visit: Payer: Self-pay

## 2022-04-20 ENCOUNTER — Encounter (HOSPITAL_BASED_OUTPATIENT_CLINIC_OR_DEPARTMENT_OTHER): Payer: Self-pay

## 2022-04-20 ENCOUNTER — Emergency Department (HOSPITAL_BASED_OUTPATIENT_CLINIC_OR_DEPARTMENT_OTHER): Payer: BC Managed Care – PPO

## 2022-04-20 ENCOUNTER — Other Ambulatory Visit (HOSPITAL_BASED_OUTPATIENT_CLINIC_OR_DEPARTMENT_OTHER): Payer: Self-pay

## 2022-04-20 DIAGNOSIS — I10 Essential (primary) hypertension: Secondary | ICD-10-CM | POA: Diagnosis not present

## 2022-04-20 DIAGNOSIS — Z79899 Other long term (current) drug therapy: Secondary | ICD-10-CM | POA: Diagnosis not present

## 2022-04-20 DIAGNOSIS — L03031 Cellulitis of right toe: Secondary | ICD-10-CM | POA: Insufficient documentation

## 2022-04-20 DIAGNOSIS — M79674 Pain in right toe(s): Secondary | ICD-10-CM | POA: Diagnosis present

## 2022-04-20 DIAGNOSIS — L03039 Cellulitis of unspecified toe: Secondary | ICD-10-CM

## 2022-04-20 MED ORDER — LIDOCAINE HCL (PF) 1 % IJ SOLN
2.0000 mL | Freq: Once | INTRAMUSCULAR | Status: DC
  Filled 2022-04-20: qty 5

## 2022-04-20 MED ORDER — DOXYCYCLINE HYCLATE 100 MG PO CAPS
100.0000 mg | ORAL_CAPSULE | Freq: Two times a day (BID) | ORAL | 0 refills | Status: AC
  Filled 2022-04-20: qty 14, 7d supply, fill #0

## 2022-04-20 NOTE — ED Notes (Signed)
Lidocaine at bedside for provider  

## 2022-04-20 NOTE — ED Triage Notes (Signed)
Pt reports right great toe pain for 2 weeks. Noticed blood under nail bed. No injury reported

## 2022-04-20 NOTE — ED Provider Notes (Signed)
[facility] EMERGENCY DEPARTMENT Provider Note   CSN: [id number] Arrival date & time: 04/20/22  1247     History  Chief Complaint  Patient presents with   Toe Pain    [name] [name]. is a 36 y.o. male.  HPI  Medical history including obesity, hypertension, presents with complaints of right toe pain, started about 2 weeks ago, came on suddenly, denies any traumatic injury to the area, states that he noted some blood underneath his toenail, he also notes that he had some pain along the distal medial aspect of the toe, states that he has had paronychia in the past had to be started on antibiotics.  Home Medications Prior to Admission medications   Medication Sig Start Date End Date Taking? Authorizing Provider  doxycycline (VIBRAMYCIN) 100 MG capsule Take 1 capsule (100 mg total) by mouth 2 (two) times daily for 7 days. 04/20/22 04/27/22 Yes Carroll Sage, PA-C  amoxicillin (AMOXIL) 500 MG capsule Take 1 capsule (500 mg total) by mouth 3 (three) times daily. 01/23/22   Smoot, Shawn Route, PA-C  cyclobenzaprine (FLEXERIL) 10 MG tablet Take 1 tablet (10 mg total) by mouth 2 (two) times daily as needed for muscle spasms. 10/09/20   Linwood Dibbles, MD  naproxen (NAPROSYN) 500 MG tablet Take 1 tablet (500 mg total) by mouth 2 (two) times daily with a meal. As needed for pain 10/09/20   Linwood Dibbles, MD  propranolol ER (INDERAL LA) 80 MG 24 hr capsule Take 1 capsule (80 mg total) by mouth daily. Patient not taking: No sig reported 05/26/15   Veryl Speak, FNP      Allergies    Gramineae pollens    Review of Systems   Review of Systems  Constitutional:  Negative for chills and fever.  Respiratory:  Negative for shortness of breath.   Cardiovascular:  Negative for chest pain.  Gastrointestinal:  Negative for abdominal pain.  Musculoskeletal:        Right toe pain  Neurological:  Negative for headaches.    Physical Exam Updated Vital Signs BP 132/66   Pulse 80   Temp  98 F (36.7 C) (Oral)   Resp 16   Ht 5\' 10"  (1.778 m)   Wt (!) 176.4 kg   SpO2 97%   BMI 55.82 kg/m  Physical Exam Vitals and nursing note reviewed.  Constitutional:      General: He is not in acute distress.    Appearance: He is not ill-appearing.  HENT:     Head: Normocephalic and atraumatic.     Nose: No congestion.  Eyes:     Conjunctiva/sclera: Conjunctivae normal.  Cardiovascular:     Rate and Rhythm: Normal rate and regular rhythm.     Pulses: Normal pulses.  Pulmonary:     Effort: Pulmonary effort is normal.  Musculoskeletal:     Comments: Focused exam right toe reveals subungual hematoma at the proximal aspect of the toe plate, slight edema noted on the distal passage of the great toe itself, tender to palpation in that area, 2-second capillary refill, full range of motion in all joints of his toe.  Neurovascular fully intact.  Skin:    General: Skin is warm and dry.  Neurological:     Mental Status: He is alert.  Psychiatric:        Mood and Affect: Mood normal.     ED Results / Procedures / Treatments   Labs (all labs ordered are listed, but only  abnormal results are displayed) Labs Reviewed - No data to display  EKG None  Radiology DG Toe Great Right  Result Date: 04/20/2022 CLINICAL DATA:  Pain along the medial aspect of the distal great toe EXAM: RIGHT GREAT TOE COMPARISON:  None Available. FINDINGS: No acute fracture. No joint malalignment. Mild osteoarthritic change. No evidence of acute fracture or joint malalignment. IMPRESSION: No acute findings. Electronically Signed   By: Feliberto Harts M.D.   On: 04/20/2022 15:33    Procedures Procedures    Medications Ordered in ED Medications  lidocaine (PF) (XYLOCAINE) 1 % injection 2 mL (has no administration in time range)    ED Course/ Medical Decision Making/ A&P                           Medical Decision Making Amount and/or Complexity of Data Reviewed Radiology:  ordered.  Risk Prescription drug management.   This patient presents to the ED for concern of right toe pain, this involves an extensive number of treatment options, and is a complaint that carries with it a high risk of complications and morbidity.  The differential diagnosis includes fracture, dislocation, compartment syndrome, paronychia    Additional history obtained:  Additional history obtained from N/A External records from outside source obtained and reviewed including N/A   Co morbidities that complicate the patient evaluation  N/A  Social Determinants of Health:  N/A    Lab Tests:  I Ordered, and personally interpreted labs.  The pertinent results include: N/A   Imaging Studies ordered:  I ordered imaging studies including DG right toe I independently visualized and interpreted imaging which showed negative acute findings I agree with the radiologist interpretation   Cardiac Monitoring:  The patient was maintained on a cardiac monitor.  I personally viewed and interpreted the cardiac monitored which showed an underlying rhythm of: N/A   Medicines ordered and prescription drug management:  I ordered medication including N/A I have reviewed the patients home medicines and have made adjustments as needed  Critical Interventions:  N/A   Reevaluation:  Presents with right toe pain, concern for fracture and/or paronychia, will obtain imaging for further evaluation, will also recommend I&D.  Updated patient on imaging, patient states he would like to defer on I&D at this time and try antibiotics.      Consultations Obtained:  N/A    Test Considered:  I&D-shared decision making we will defer this, patient like to try a trial of antibiotics, I find this reasonable as I do not appreciate significant mount of erythema or edema in the area, or palpable fluctuance.    Rule out I have low suspicion for septic arthritis as patient denies IV drug  use, skin exam was performed no erythematous, edematous, warm joints noted on exam, no new heart murmur heard on exam.  Low suspicion for fracture or dislocation as x-ray does not feel any significant findings. low suspicion for ligament or tendon damage as area was palpated no gross defects noted, they had full range of motion as well as 5/5 strength.  Low suspicion for compartment syndrome as area was palpated it was soft to the touch, neurovascular fully intact.  Will defer on trephination patient's had this for last 2 weeks, patient would have little to no benefit.     Dispostion and problem list  After consideration of the diagnostic results and the patients response to treatment, I feel that the patent would benefit from discharge.  Toe pain-suspect beginning of paronychia, will start on antibiotics, follow with PCP as needed strict return precautions.            Final Clinical Impression(s) / ED Diagnoses Final diagnoses:  Paronychia of great toe    Rx / DC Orders ED Discharge Orders          Ordered    doxycycline (VIBRAMYCIN) 100 MG capsule  2 times daily        04/20/22 1545              Carroll Sage, PA-C 04/20/22 1546    Franne Forts, DO 04/21/22 [phone]

## 2022-04-20 NOTE — Discharge Instructions (Signed)
Likely you have an infection in your toe, started you on antibiotics please take as prescribed.   come back in for reassessment if you notice after 3 days of antibiotics the pain is gotten worse, you have worsening redness or swelling in the area as you might need an I&D at that time

## 2022-12-08 ENCOUNTER — Emergency Department (HOSPITAL_BASED_OUTPATIENT_CLINIC_OR_DEPARTMENT_OTHER)
Admission: EM | Admit: 2022-12-08 | Discharge: 2022-12-08 | Disposition: A | Discharge status: Home / Self Care | Payer: BC Managed Care – PPO | Attending: Emergency Medicine | Admitting: Emergency Medicine

## 2022-12-08 ENCOUNTER — Encounter (HOSPITAL_BASED_OUTPATIENT_CLINIC_OR_DEPARTMENT_OTHER): Payer: Self-pay | Admitting: Emergency Medicine

## 2022-12-08 DIAGNOSIS — K120 Recurrent oral aphthae: Secondary | ICD-10-CM | POA: Insufficient documentation

## 2022-12-08 DIAGNOSIS — K1379 Other lesions of oral mucosa: Secondary | ICD-10-CM | POA: Diagnosis present

## 2022-12-08 MED ORDER — TRIAMCINOLONE ACETONIDE 0.1 % MT PSTE: 1.0000 | PASTE | Freq: Two times a day (BID) | OROMUCOSAL | 0 refills | Status: AC

## 2022-12-08 NOTE — ED Triage Notes (Signed)
Pt reports pain to inside of LT cheek x 2d; sts he thinks he bit it the other day

## 2022-12-08 NOTE — Discharge Instructions (Addendum)
I have prescribed oral paste, please apply this to your wound, two times a day for the next 7 days or until this goes away.

## 2022-12-08 NOTE — ED Notes (Signed)
Discharge paperwork reviewed entirely with patient, including Rx's and follow up care. Pain was under control. Pt verbalized understanding as well as all parties involved. No questions or concerns voiced at the time of discharge. No acute distress noted.   Pt ambulated out to PVA without incident or assistance.

## 2022-12-08 NOTE — ED Provider Notes (Signed)
[place] [facility] Provider Note   CSN: [id number] Arrival date & time: 12/08/22  1516     History  Chief Complaint  Patient presents with   Mouth Pain    [name] [name]. is a 37 y.o. male.  37 y.o male with no past medical history presents to the ED with a chief complaint of left cheek pain which began approximately 2 days ago.  Patient reports he thinks he likely bit his cheek, has been experiencing some pain along that side.  He reports this pain is exacerbated with any type of eating and drinking.  He reports that he has a headache from the pain being significant.  He has not taken any medication for improvement in symptoms.  No other complaints reported.  Patient is not immunocompromise.  The history is provided by the patient.       Home Medications Prior to Admission medications   Medication Sig Start Date End Date Taking? Authorizing Provider  triamcinolone (KENALOG) 0.1 % paste Use as directed 1 Application in the mouth or throat 2 (two) times daily for 7 days. 12/08/22 12/15/22 Yes Anikka Marsan, Beverley Fiedler, PA-C  amoxicillin (AMOXIL) 500 MG capsule Take 1 capsule (500 mg total) by mouth 3 (three) times daily. 01/23/22   Smoot, Leary Roca, PA-C  cyclobenzaprine (FLEXERIL) 10 MG tablet Take 1 tablet (10 mg total) by mouth 2 (two) times daily as needed for muscle spasms. 10/09/20   Dorie Rank, MD  naproxen (NAPROSYN) 500 MG tablet Take 1 tablet (500 mg total) by mouth 2 (two) times daily with a meal. As needed for pain 10/09/20   Dorie Rank, MD  propranolol ER (INDERAL LA) 80 MG 24 hr capsule Take 1 capsule (80 mg total) by mouth daily. Patient not taking: No sig reported 05/26/15   Golden Circle, FNP      Allergies    Gramineae pollens    Review of Systems   Review of Systems  Constitutional:  Negative for fever.  Skin:  Positive for wound.    Physical Exam Updated Vital Signs BP (!) 156/83 (BP Location: Right Wrist)   Pulse 65    Temp 98.4 F (36.9 C) (Oral)   Resp 16   Ht '5\' 10"'$  (1.778 m)   Wt (!) 174.6 kg   SpO2 99%   BMI 55.24 kg/m  Physical Exam Vitals and nursing note reviewed.  Constitutional:      Appearance: Normal appearance.  HENT:     Head: Normocephalic and atraumatic.     Mouth/Throat:     Lips: Pink.     Mouth: Mucous membranes are moist.   Eyes:     Pupils: Pupils are equal, round, and reactive to light.  Cardiovascular:     Rate and Rhythm: Normal rate.  Pulmonary:     Effort: Pulmonary effort is normal.  Abdominal:     General: Abdomen is flat.  Musculoskeletal:     Cervical back: Normal range of motion and neck supple.  Skin:    General: Skin is warm and dry.  Neurological:     Mental Status: He is alert and oriented to person, place, and time.     ED Results / Procedures / Treatments   Labs (all labs ordered are listed, but only abnormal results are displayed) Labs Reviewed - No data to display  EKG None  Radiology No results found.  Procedures Procedures    Medications Ordered in ED Medications - No data  to display  ED Course/ Medical Decision Making/ A&P                             Medical Decision Making    Patient presents to the ED with a chief complaint of mild lesion to the left inner cheek for the past 2 days.  Reports he thought he likely bit his cheek.  He is having significant pain to the area, exacerbated with any food intake.  Has not run any fever or any systemic signs.  On evaluation there is a small canker sore noted to the left inner part of his cheek.  No surrounding erythema, no pus or drainage.  Dentition appears normal without any signs of infection.  I discussed with patient treatment with paste application to inside his mouth, this will likely resolve.  Patient denies any history of immunocompromise.  I do not suspect other acute process at this time.  Patient is hemodynamically stable for discharge.    Portions of this note were  generated with [employer]. Dictation errors may occur despite best attempts at proofreading.   Final Clinical Impression(s) / ED Diagnoses Final diagnoses:  Aphthous stomatitis    Rx / DC Orders ED Discharge Orders          Ordered    triamcinolone (KENALOG) 0.1 % paste  2 times daily        12/08/22 1642              Janeece Fitting, PA-C 12/08/22 1644    Gareth Morgan, MD 12/09/22 1259

## 2024-05-05 ENCOUNTER — Other Ambulatory Visit: Payer: Self-pay

## 2024-05-05 ENCOUNTER — Emergency Department (HOSPITAL_BASED_OUTPATIENT_CLINIC_OR_DEPARTMENT_OTHER)
Admission: EM | Admit: 2024-05-05 | Discharge: 2024-05-05 | Disposition: A | Discharge status: Home / Self Care | Payer: Self-pay | Attending: Emergency Medicine | Admitting: Emergency Medicine

## 2024-05-05 ENCOUNTER — Encounter (HOSPITAL_BASED_OUTPATIENT_CLINIC_OR_DEPARTMENT_OTHER): Payer: Self-pay

## 2024-05-05 DIAGNOSIS — M5442 Lumbago with sciatica, left side: Secondary | ICD-10-CM | POA: Insufficient documentation

## 2024-05-05 DIAGNOSIS — M25562 Pain in left knee: Secondary | ICD-10-CM | POA: Insufficient documentation

## 2024-05-05 MED ORDER — METHYLPREDNISOLONE 4 MG PO TBPK: ORAL_TABLET | ORAL | 0 refills | Status: AC

## 2024-05-05 MED ORDER — DICLOFENAC SODIUM 1 % EX GEL: 4.0000 g | Freq: Four times a day (QID) | CUTANEOUS | 0 refills | Status: AC

## 2024-05-05 MED ORDER — METHOCARBAMOL 500 MG PO TABS: 500.0000 mg | ORAL_TABLET | Freq: Two times a day (BID) | ORAL | 0 refills | Status: AC

## 2024-05-05 MED ORDER — KETOROLAC TROMETHAMINE 15 MG/ML IJ SOLN
15.0000 mg | Freq: Once | INTRAMUSCULAR | Status: AC
Start: 2024-05-05 — End: 2024-05-05
  Administered 2024-05-05: 15 mg via INTRAMUSCULAR
  Filled 2024-05-05: qty 1

## 2024-05-05 MED ORDER — ACETAMINOPHEN 500 MG PO TABS
1000.0000 mg | ORAL_TABLET | Freq: Once | ORAL | Status: AC
  Administered 2024-05-05: 1000 mg via ORAL
  Filled 2024-05-05: qty 2

## 2024-05-05 MED ORDER — DIAZEPAM 5 MG PO TABS
5.0000 mg | ORAL_TABLET | Freq: Once | ORAL | Status: AC
Start: 2024-05-05 — End: 2024-05-05
  Administered 2024-05-05: 5 mg via ORAL
  Filled 2024-05-05: qty 1

## 2024-05-05 MED ORDER — OXYCODONE HCL 5 MG PO TABS
5.0000 mg | ORAL_TABLET | Freq: Once | ORAL | Status: AC
  Administered 2024-05-05: 5 mg via ORAL
  Filled 2024-05-05: qty 1

## 2024-05-05 NOTE — ED Notes (Signed)
 Pt is obtaining a driver due to med administration

## 2024-05-05 NOTE — ED Provider Notes (Signed)
 [place] [facility] AT [facility] Provider Note   CSN: [id number] Arrival date & time: 05/05/24  1410     Patient presents with: Back Pain and Knee Pain   [place] CHRISTELLA Shelagh Mickey. is a 38 y.o. male.   38 yo M with a chief complaint of left-sided low back pain that radiates down the leg.  He has had this problem for it was told it was sciatica.  Worsening tries to stand and ambulate.  Tells me that he is usually not on his legs very long since this has been going on.  Atraumatic denies loss of bowel or bladder denies loss of rectal sensation denies numbness or weakness of the leg.  He has been trying ibuprofen  and muscle relaxant at home but without significant improvement.   Back Pain Knee Pain Associated symptoms: back pain        Prior to Admission medications   Medication Sig Start Date End Date Taking? Authorizing Provider  diclofenac  Sodium (VOLTAREN ) 1 % GEL Apply 4 g topically 4 (four) times daily. 05/05/24  Yes Emil Share, DO  methocarbamol  (ROBAXIN ) 500 MG tablet Take 1 tablet (500 mg total) by mouth 2 (two) times daily. 05/05/24  Yes Emil Share, DO  methylPREDNISolone  (MEDROL  DOSEPAK) 4 MG TBPK tablet Day 1: 8mg  before breakfast, 4 mg after lunch, 4 mg after supper, and 8 mg at bedtime Day 2: 4 mg before breakfast, 4 mg after lunch, 4 mg  after supper, and 8 mg  at bedtime Day 3:  4 mg  before breakfast, 4 mg  after lunch, 4 mg after supper, and 4 mg  at bedtime Day 4: 4 mg  before breakfast, 4 mg  after lunch, and 4 mg at bedtime Day 5: 4 mg  before breakfast and 4 mg at bedtime Day 6: 4 mg  before breakfast 05/05/24  Yes Lilliann Rossetti, DO  amoxicillin  (AMOXIL ) 500 MG capsule Take 1 capsule (500 mg total) by mouth 3 (three) times daily. 01/23/22   Smoot, Lauraine LABOR, PA-C  cyclobenzaprine  (FLEXERIL ) 10 MG tablet Take 1 tablet (10 mg total) by mouth 2 (two) times daily as needed for muscle spasms. 10/09/20   Randol Simmonds, MD  naproxen  (NAPROSYN ) 500 MG tablet Take 1  tablet (500 mg total) by mouth 2 (two) times daily with a meal. As needed for pain 10/09/20   Randol Simmonds, MD  propranolol  ER (INDERAL  LA) 80 MG 24 hr capsule Take 1 capsule (80 mg total) by mouth daily. Patient not taking: No sig reported 05/26/15   Philemon Cordella BIRCH, FNP    Allergies: Gramineae pollens    Review of Systems  Musculoskeletal:  Positive for back pain.    Updated Vital Signs BP (!) 177/66 (BP Location: Left Wrist)   Pulse 88   Temp 98.3 F (36.8 C) (Oral)   Resp 18   Wt (!) 176.9 kg   SpO2 99%   BMI 55.96 kg/m   Physical Exam Vitals and nursing note reviewed.  Constitutional:      Appearance: He is well-developed.  HENT:     Head: Normocephalic and atraumatic.  Eyes:     Pupils: Pupils are equal, round, and reactive to light.  Neck:     Vascular: No JVD.  Cardiovascular:     Rate and Rhythm: Normal rate and regular rhythm.     Heart sounds: No murmur heard.    No friction rub. No gallop.  Pulmonary:     Effort: No  respiratory distress.     Breath sounds: No wheezing.  Abdominal:     General: There is no distension.     Tenderness: There is no abdominal tenderness. There is no guarding or rebound.  Musculoskeletal:        General: Normal range of motion.     Cervical back: Normal range of motion and neck supple.     Comments: Pulse motor and sensation intact to left lower extremity.  Reflexes are 2+ and equal.  No clonus.  No obvious effusion to the knee.  No erythema no warmth.  Able to range the knee with some discomfort.  Skin:    Coloration: Skin is not pale.     Findings: No rash.  Neurological:     Mental Status: He is alert and oriented to person, place, and time.  Psychiatric:        Behavior: Behavior normal.     (all labs ordered are listed, but only abnormal results are displayed) Labs Reviewed - No data to display  EKG: None  Radiology: No results found.   Procedures   Medications Ordered in the ED  acetaminophen  (TYLENOL )  tablet 1,000 mg (has no administration in time range)  ketorolac  (TORADOL ) 15 MG/ML injection 15 mg (has no administration in time range)  oxyCODONE  (Oxy IR/ROXICODONE ) immediate release tablet 5 mg (has no administration in time range)  diazepam  (VALIUM ) tablet 5 mg (has no administration in time range)                                    Medical Decision Making Risk OTC drugs. Prescription drug management.   38 yo M with a chief complaint of left-sided low back pain that radiates to the knee.  Patient has a history of the same.  Was told that it was sciatica.  Has been trying stretches and over-the-counter medications at home without improvement.  No red flags.  Benign exam.  Will treat symptomatically.  PCP follow-up.  2:35 PM:  I have discussed the diagnosis/risks/treatment options with the patient.  Evaluation and diagnostic testing in the emergency department does not suggest an emergent condition requiring admission or immediate intervention beyond what has been performed at this time.  They will follow up with PCP, sports med. We also discussed returning to the ED immediately if new or worsening sx occur. We discussed the sx which are most concerning (e.g., sudden worsening pain, fever, inability to tolerate by mouth, cauda equina s/sx) that necessitate immediate return. Medications administered to the patient during their visit and any new prescriptions provided to the patient are listed below.  Medications given during this visit Medications  acetaminophen  (TYLENOL ) tablet 1,000 mg (has no administration in time range)  ketorolac  (TORADOL ) 15 MG/ML injection 15 mg (has no administration in time range)  oxyCODONE  (Oxy IR/ROXICODONE ) immediate release tablet 5 mg (has no administration in time range)  diazepam  (VALIUM ) tablet 5 mg (has no administration in time range)     The patient appears reasonably screen and/or stabilized for discharge and I doubt any other medical  condition or other [facility] requiring further screening, evaluation, or treatment in the ED at this time prior to discharge.       Final diagnoses:  Acute left-sided low back pain with left-sided sciatica  Acute pain of left knee    ED Discharge Orders          Ordered  methylPREDNISolone  (MEDROL  DOSEPAK) 4 MG TBPK tablet        05/05/24 1433    methocarbamol  (ROBAXIN ) 500 MG tablet  2 times daily        05/05/24 1433    diclofenac  Sodium (VOLTAREN ) 1 % GEL  4 times daily        05/05/24 1433               Emil Share, DO 05/05/24 1435

## 2024-05-05 NOTE — ED Triage Notes (Signed)
 Reports lower L side back pain radiating down to L knee since Friday. States pain worse when standing.   Reports hx of sciatic pain-says it feels similar  No known injury

## 2024-05-05 NOTE — Discharge Instructions (Addendum)
Your back pain is most likely due to a muscular strain.  There is been a lot of research on back pain, unfortunately the only thing that seems to really help is Tylenol and ibuprofen.  Relative rest is also important to not lift greater than 10 pounds bending or twisting at the waist.  Please follow-up with your family physician.  The other thing that really seems to benefit patients is physical therapy which your doctor may send you for.  Please return to the emergency department for new numbness or weakness to your arms or legs. Difficulty with urinating or urinating or pooping on yourself.  Also if you cannot feel toilet paper when you wipe or get a fever.   Use the steroids as prescribed.  Use the gel as prescribed. Also take tylenol '1000mg'$ (2 extra strength) four times a day.
